# Patient Record
Sex: Female | Born: 1965
Health system: Southern US, Community
[De-identification: ages and names within clinical notes are randomized; demographics above are authoritative.]

## PROBLEM LIST (undated history)

## (undated) DIAGNOSIS — F419 Anxiety disorder, unspecified: Secondary | ICD-10-CM

## (undated) DIAGNOSIS — N189 Chronic kidney disease, unspecified: Secondary | ICD-10-CM

## (undated) DIAGNOSIS — E119 Type 2 diabetes mellitus without complications: Secondary | ICD-10-CM

## (undated) DIAGNOSIS — Z9889 Other specified postprocedural states: Secondary | ICD-10-CM

## (undated) DIAGNOSIS — F329 Major depressive disorder, single episode, unspecified: Secondary | ICD-10-CM

## (undated) DIAGNOSIS — E041 Nontoxic single thyroid nodule: Secondary | ICD-10-CM

## (undated) DIAGNOSIS — C801 Malignant (primary) neoplasm, unspecified: Secondary | ICD-10-CM

## (undated) DIAGNOSIS — M1990 Unspecified osteoarthritis, unspecified site: Secondary | ICD-10-CM

## (undated) DIAGNOSIS — R0602 Shortness of breath: Secondary | ICD-10-CM

## (undated) DIAGNOSIS — F4310 Post-traumatic stress disorder, unspecified: Secondary | ICD-10-CM

## (undated) DIAGNOSIS — R51 Headache: Secondary | ICD-10-CM

## (undated) DIAGNOSIS — J40 Bronchitis, not specified as acute or chronic: Secondary | ICD-10-CM

## (undated) DIAGNOSIS — J45909 Unspecified asthma, uncomplicated: Secondary | ICD-10-CM

## (undated) DIAGNOSIS — K219 Gastro-esophageal reflux disease without esophagitis: Secondary | ICD-10-CM

## (undated) DIAGNOSIS — G4730 Sleep apnea, unspecified: Secondary | ICD-10-CM

## (undated) DIAGNOSIS — D649 Anemia, unspecified: Secondary | ICD-10-CM

## (undated) DIAGNOSIS — R112 Nausea with vomiting, unspecified: Secondary | ICD-10-CM

## (undated) DIAGNOSIS — F32A Depression, unspecified: Secondary | ICD-10-CM

## (undated) HISTORY — PX: APPENDECTOMY: SHX54

## (undated) HISTORY — PX: OTHER SURGICAL HISTORY: SHX169

## (undated) HISTORY — PX: HERNIA REPAIR: SHX51

## (undated) HISTORY — PX: SHOULDER OPEN ROTATOR CUFF REPAIR: SHX2407

## (undated) HISTORY — PX: OVARIAN CYST SURGERY: SHX726

## (undated) HISTORY — PX: SEPTOPLASTY: SUR1290

## (undated) HISTORY — PX: CHOLECYSTECTOMY: SHX55

## (undated) HISTORY — PX: PLANTAR FASCIA RELEASE: SHX2239

## (undated) HISTORY — PX: BREAST SURGERY: SHX581

## (undated) HISTORY — PX: KNEE ARTHROSCOPY: SUR90

## (undated) HISTORY — PX: TONSILLECTOMY: SUR1361

---

## 2005-08-05 HISTORY — PX: GASTRIC BYPASS: SHX52

## 2008-12-19 ENCOUNTER — Emergency Department (HOSPITAL_COMMUNITY): Admission: EM | Admit: 2008-12-19 | Discharge: 2008-12-19 | Payer: Self-pay | Admitting: Emergency Medicine

## 2013-01-18 DIAGNOSIS — Z9889 Other specified postprocedural states: Secondary | ICD-10-CM | POA: Insufficient documentation

## 2014-01-27 ENCOUNTER — Other Ambulatory Visit: Payer: Self-pay | Admitting: Orthopedic Surgery

## 2014-02-15 ENCOUNTER — Ambulatory Visit (HOSPITAL_COMMUNITY)
Admission: RE | Admit: 2014-02-15 | Discharge: 2014-02-15 | Disposition: A | Discharge status: Home / Self Care | Payer: Medicaid Other | Source: Ambulatory Visit | Attending: Orthopedic Surgery | Admitting: Orthopedic Surgery

## 2014-02-15 ENCOUNTER — Encounter (HOSPITAL_COMMUNITY): Payer: Self-pay

## 2014-02-15 ENCOUNTER — Encounter (HOSPITAL_COMMUNITY)
Admission: RE | Admit: 2014-02-15 | Discharge: 2014-02-15 | Disposition: A | Discharge status: Home / Self Care | Payer: Medicaid Other | Source: Ambulatory Visit | Attending: Orthopedic Surgery | Admitting: Orthopedic Surgery

## 2014-02-15 DIAGNOSIS — Z01818 Encounter for other preprocedural examination: Secondary | ICD-10-CM | POA: Insufficient documentation

## 2014-02-15 HISTORY — DX: Chronic kidney disease, unspecified: N18.9

## 2014-02-15 HISTORY — DX: Depression, unspecified: F32.A

## 2014-02-15 HISTORY — DX: Anxiety disorder, unspecified: F41.9

## 2014-02-15 HISTORY — DX: Malignant (primary) neoplasm, unspecified: C80.1

## 2014-02-15 HISTORY — DX: Shortness of breath: R06.02

## 2014-02-15 HISTORY — DX: Nausea with vomiting, unspecified: R11.2

## 2014-02-15 HISTORY — DX: Anemia, unspecified: D64.9

## 2014-02-15 HISTORY — DX: Gastro-esophageal reflux disease without esophagitis: K21.9

## 2014-02-15 HISTORY — DX: Major depressive disorder, single episode, unspecified: F32.9

## 2014-02-15 HISTORY — DX: Headache: R51

## 2014-02-15 HISTORY — DX: Bronchitis, not specified as acute or chronic: J40

## 2014-02-15 HISTORY — DX: Type 2 diabetes mellitus without complications: E11.9

## 2014-02-15 HISTORY — DX: Other specified postprocedural states: Z98.890

## 2014-02-15 HISTORY — DX: Unspecified osteoarthritis, unspecified site: M19.90

## 2014-02-15 HISTORY — DX: Sleep apnea, unspecified: G47.30

## 2014-02-15 LAB — BASIC METABOLIC PANEL
ANION GAP: 12 (ref 5–15)
BUN: 7 mg/dL (ref 6–23)
CALCIUM: 8.9 mg/dL (ref 8.4–10.5)
CO2: 26 meq/L (ref 19–32)
CREATININE: 0.65 mg/dL (ref 0.50–1.10)
Chloride: 104 mEq/L (ref 96–112)
GFR calc non Af Amer: >90 mL/min (ref >90)
Glucose, Bld: 104 mg/dL — ABNORMAL HIGH (ref 70–99)
Potassium: 4 mEq/L (ref 3.7–5.3)
Sodium: 142 mEq/L (ref 137–147)

## 2014-02-15 LAB — CBC WITH DIFFERENTIAL/PLATELET
BASOS ABS: 0 10*3/uL (ref 0.0–0.1)
BASOS PCT: 0 % (ref 0–1)
EOS ABS: 0.1 10*3/uL (ref 0.0–0.7)
EOS PCT: 1 % (ref 0–5)
HEMATOCRIT: 36.2 % (ref 36.0–46.0)
Hemoglobin: 11.3 g/dL — ABNORMAL LOW (ref 12.0–15.0)
Lymphocytes Relative: 31 % (ref 12–46)
Lymphs Abs: 2.3 10*3/uL (ref 0.7–4.0)
MCH: 26.7 pg (ref 26.0–34.0)
MCHC: 31.2 g/dL (ref 30.0–36.0)
MCV: 85.6 fL (ref 78.0–100.0)
MONO ABS: 0.4 10*3/uL (ref 0.1–1.0)
Monocytes Relative: 6 % (ref 3–12)
NEUTROS ABS: 4.5 10*3/uL (ref 1.7–7.7)
Neutrophils Relative %: 62 % (ref 43–77)
Platelets: 249 10*3/uL (ref 150–400)
RBC: 4.23 MIL/uL (ref 3.87–5.11)
RDW: 15 % (ref 11.5–15.5)
WBC: 7.4 10*3/uL (ref 4.0–10.5)

## 2014-02-15 LAB — URINALYSIS, ROUTINE W REFLEX MICROSCOPIC
Bilirubin Urine: NEGATIVE
GLUCOSE, UA: NEGATIVE mg/dL
Ketones, ur: NEGATIVE mg/dL
LEUKOCYTES UA: NEGATIVE
NITRITE: NEGATIVE
Protein, ur: NEGATIVE mg/dL
SPECIFIC GRAVITY, URINE: 1.012 (ref 1.005–1.030)
Urobilinogen, UA: 1 mg/dL (ref 0.0–1.0)
pH: 6 (ref 5.0–8.0)

## 2014-02-15 LAB — TYPE AND SCREEN
ABO/RH(D): O POS
Antibody Screen: NEGATIVE

## 2014-02-15 LAB — SURGICAL PCR SCREEN
MRSA, PCR: NEGATIVE
STAPHYLOCOCCUS AUREUS: NEGATIVE

## 2014-02-15 LAB — URINE MICROSCOPIC-ADD ON

## 2014-02-15 LAB — APTT: APTT: 30 s (ref 24–37)

## 2014-02-15 LAB — ABO/RH: ABO/RH(D): O POS

## 2014-02-15 LAB — PROTIME-INR
INR: 1.02 (ref 0.00–1.49)
Prothrombin Time: 13.4 seconds (ref 11.6–15.2)

## 2014-02-15 MED ORDER — CHLORHEXIDINE GLUCONATE 4 % EX LIQD: 60.0000 mL | Freq: Once | CUTANEOUS | Status: DC

## 2014-02-15 NOTE — Pre-Procedure Instructions (Addendum)
Sally Garrison  02/15/2014   Your procedure is scheduled on:  Monday July 27 th at 0900 AM  Report to [facility] Admitting at 0700 AM.  Call this number if you have problems the morning of surgery: [phone]   Remember:   Do not eat food or drink liquids after midnight Sunday.   Take these medicines the morning of surgery with A SIP OF WATER: Prozac, Hydrocodone-acetaminophen if needed for pain, and Prilosec. Stop Meloxicam, , Aspirin, Nsaids, and herbal meds.  Do not wear jewelry, make-up or nail polish.  Do not wear lotions, powders, or perfumes. You may wear deodorant.  Do not shave 48 hours prior to surgery.   Do not bring valuables to the hospital.  [facility] is not responsible for any belongings or valuables.               Contacts, dentures or bridgework may not be worn into surgery.  Leave suitcase in the car. After surgery it may be brought to your room.  For patients admitted to the hospital, discharge time is determined by your treatment team.               Patients discharged the day of surgery will not be allowed to drive home.    Special Instructions: [place] - Preparing for Surgery  Before surgery, you can play an important role.  Because skin is not sterile, your skin needs to be as free of germs as possible.  You can reduce the number of germs on you skin by washing with CHG (chlorahexidine gluconate) soap before surgery.  CHG is an antiseptic cleaner which kills germs and bonds with the skin to continue killing germs even after washing.  Please DO NOT use if you have an allergy to CHG or antibacterial soaps.  If your skin becomes reddened/irritated stop using the CHG and inform your nurse when you arrive at Short Stay.  Do not shave (including legs and underarms) for at least 48 hours prior to the first CHG shower.  You may shave your face.  Please follow these instructions carefully:   1.  Shower with CHG Soap the night before surgery and the                                 morning of Surgery.  2.  If you choose to wash your hair, wash your hair first as usual with your       normal shampoo.  3.  After you shampoo, rinse your hair and body thoroughly to remove the                      Shampoo.  4.  Use CHG as you would any other liquid soap.  You can apply chg directly       to the skin and wash gently with scrungie or a clean washcloth.  5.  Apply the CHG Soap to your body ONLY FROM THE NECK DOWN.        Do not use on open wounds or open sores.  Avoid contact with your eyes,       ears, mouth and genitals (private parts).  Wash genitals (private parts)       with your normal soap.  6.  Wash thoroughly, paying special attention to the area where your surgery        will be performed.  7.  Thoroughly rinse your body with warm water from the neck down.  8.  DO NOT shower/wash with your normal soap after using and rinsing off       the CHG Soap.  9.  Pat yourself dry with a clean towel.            10.  Wear clean pajamas.            11.  Place clean sheets on your bed the night of your first shower and do not        sleep with pets.  Day of Surgery  Do not apply any lotions/deoderants the morning of surgery.  Please wear clean clothes to the hospital/surgery center.      Please read over the following fact sheets that you were given: Pain Booklet, Coughing and Deep Breathing, Blood Transfusion Information, MRSA Information and Surgical Site Infection Prevention

## 2014-02-16 ENCOUNTER — Other Ambulatory Visit (HOSPITAL_COMMUNITY): Payer: Self-pay

## 2014-02-27 DIAGNOSIS — M1711 Unilateral primary osteoarthritis, right knee: Secondary | ICD-10-CM | POA: Diagnosis present

## 2014-02-27 MED ORDER — TRANEXAMIC ACID 100 MG/ML IV SOLN
1000.0000 mg | INTRAVENOUS | Status: AC
  Administered 2014-02-28: 1000 mg via INTRAVENOUS
  Filled 2014-02-27: qty 10

## 2014-02-27 MED ORDER — BUPIVACAINE LIPOSOME 1.3 % IJ SUSP
20.0000 mL | Freq: Once | INTRAMUSCULAR | Status: DC
Start: 2014-02-27 — End: 2014-02-28
  Filled 2014-02-27: qty 20

## 2014-02-27 NOTE — H&P (Signed)
TOTAL KNEE ADMISSION H&P  Patient is being admitted for right total knee arthroplasty.  Subjective:  Chief Complaint:right knee pain.  HPI: Sally Garrison, 48 y.o. female, has a history of pain and functional disability in the right knee due to arthritis and has failed non-surgical conservative treatments for greater than 12 weeks to includeNSAID's and/or analgesics, corticosteriod injections, viscosupplementation injections, flexibility and strengthening excercises and activity modification.  Onset of symptoms was gradual, starting 5 years ago with gradually worsening course since that time. The patient noted no past surgery on the right knee(s).  Patient currently rates pain in the right knee(s) at 10 out of 10 with activity. Patient has worsening of pain with activity and weight bearing, pain that interferes with activities of daily living, pain with passive range of motion and crepitus.  Patient has evidence of periarticular osteophytes and joint space narrowing by imaging studies. There is no active infection.  There are no active problems to display for this patient.  Past Medical History  Diagnosis Date  . PONV (postoperative nausea and vomiting)   . Sleep apnea     not using CPAP  . Shortness of breath   . Bronchitis   . Diabetes mellitus without complication     had gastric by-pass not on meds  . Depression   . Anxiety   . Chronic kidney disease     kidney stones  . GERD (gastroesophageal reflux disease)   . Headache(784.0)     migraines  . Arthritis   . Cancer     cervical Cancer  . Anemia     on monthly period since June    Past Surgical History  Procedure Laterality Date  . Septoplasty    . Tonsillectomy      adenoidectomy  . Shoulder open rotator cuff repair Right   . Breast surgery Right     lumpectomy x2  . Gastric bypass  2007  . Cholecystectomy    . Hernia repair      abdominal x3  . Appendectomy    . Ovarian cyst surgery    . Cesarean section    . Knee  arthroscopy Right   . Bone spur    . Plantar fascia release Bilateral     No prescriptions prior to admission   Allergies  Allergen Reactions  . Penicillins Anaphylaxis  . Avelox [Moxifloxacin Hcl In Nacl] Hives  . Terramycin [Oxytetracycline] Itching    History  Substance Use Topics  . Smoking status: Current Every Day Smoker -- 0.25 packs/day for 20 years    Types: Cigarettes  . Smokeless tobacco: Never Used  . Alcohol Use: No    No family history on file.   Review of Systems  Constitutional: Negative.   Eyes: Negative.   Respiratory: Negative.   Gastrointestinal: Negative.   Genitourinary: Negative.   Musculoskeletal: Positive for joint pain.  Skin: Negative.   Neurological: Positive for headaches.  Endo/Heme/Allergies: Bruises/bleeds easily.  Psychiatric/Behavioral: Positive for depression.       Sleep disorder    Objective:  Physical Exam  Constitutional: She is oriented to person, place, and time. She appears well-developed and well-nourished.  HENT:  Head: Normocephalic and atraumatic.  Neck: Normal range of motion. Neck supple.  Cardiovascular: Intact distal pulses.   Respiratory: Effort normal.  Musculoskeletal: She exhibits tenderness.  She is quite tender along the medial joint line.  Range of motion is 5 to 125 collateral ligaments are stable and she remains neurovascularly intact.  Neurological: She  is alert and oriented to person, place, and time.  Skin: Skin is warm and dry.  Psychiatric: She has a normal mood and affect. Her behavior is normal. Judgment and thought content normal.    Vital signs in last 24 hours:    Labs:   There is no height or weight on file to calculate BMI.   Imaging Review Radiographs:  X-rays were ordered, performed, and interpreted by me today included; standing AP, Rosenberg, lateral and sunrise x-rays shows moderate approaching severe arthritis of loss of articular cartilage the medial compartment right greater  than left, down to one or 2 mm medially on the right, 2-3 on the left.  There are also peripheral osteophytes to the patella on the sunrise view as well as to the trochlea.  Assessment/Plan:  End stage arthritis, right knee   The patient history, physical examination, clinical judgment of the provider and imaging studies are consistent with end stage degenerative joint disease of the right knee(s) and total knee arthroplasty is deemed medically necessary. The treatment options including medical management, injection therapy arthroscopy and arthroplasty were discussed at length. The risks and benefits of total knee arthroplasty were presented and reviewed. The risks due to aseptic loosening, infection, stiffness, patella tracking problems, thromboembolic complications and other imponderables were discussed. The patient acknowledged the explanation, agreed to proceed with the plan and consent was signed. Patient is being admitted for inpatient treatment for surgery, pain control, PT, OT, prophylactic antibiotics, VTE prophylaxis, progressive ambulation and ADL's and discharge planning. The patient is planning to be discharged home with home health services

## 2014-02-28 ENCOUNTER — Inpatient Hospital Stay (HOSPITAL_COMMUNITY)
Admission: RE | Admit: 2014-02-28 | Discharge: 2014-03-02 | DRG: 470 | Disposition: A | Discharge status: Home Health | Payer: Medicaid Other | Source: Ambulatory Visit | Attending: Orthopedic Surgery | Admitting: Orthopedic Surgery

## 2014-02-28 ENCOUNTER — Encounter (HOSPITAL_COMMUNITY): Payer: Medicaid Other | Admitting: Anesthesiology

## 2014-02-28 ENCOUNTER — Encounter (HOSPITAL_COMMUNITY): Payer: Self-pay | Admitting: Anesthesiology

## 2014-02-28 ENCOUNTER — Inpatient Hospital Stay: Admit: 2014-02-28 | Payer: Self-pay | Admitting: Orthopedic Surgery

## 2014-02-28 ENCOUNTER — Encounter (HOSPITAL_COMMUNITY)
Admission: RE | Disposition: A | Discharge status: Home Health | Payer: Self-pay | Source: Ambulatory Visit | Attending: Orthopedic Surgery

## 2014-02-28 ENCOUNTER — Inpatient Hospital Stay (HOSPITAL_COMMUNITY): Payer: Medicaid Other | Admitting: Anesthesiology

## 2014-02-28 DIAGNOSIS — K219 Gastro-esophageal reflux disease without esophagitis: Secondary | ICD-10-CM | POA: Diagnosis present

## 2014-02-28 DIAGNOSIS — M1710 Unilateral primary osteoarthritis, unspecified knee: Principal | ICD-10-CM | POA: Diagnosis present

## 2014-02-28 DIAGNOSIS — F3289 Other specified depressive episodes: Secondary | ICD-10-CM | POA: Diagnosis present

## 2014-02-28 DIAGNOSIS — Z8541 Personal history of malignant neoplasm of cervix uteri: Secondary | ICD-10-CM

## 2014-02-28 DIAGNOSIS — N189 Chronic kidney disease, unspecified: Secondary | ICD-10-CM | POA: Diagnosis present

## 2014-02-28 DIAGNOSIS — M1711 Unilateral primary osteoarthritis, right knee: Secondary | ICD-10-CM | POA: Diagnosis present

## 2014-02-28 DIAGNOSIS — E119 Type 2 diabetes mellitus without complications: Secondary | ICD-10-CM | POA: Diagnosis present

## 2014-02-28 DIAGNOSIS — F17200 Nicotine dependence, unspecified, uncomplicated: Secondary | ICD-10-CM | POA: Diagnosis present

## 2014-02-28 DIAGNOSIS — Z9884 Bariatric surgery status: Secondary | ICD-10-CM

## 2014-02-28 DIAGNOSIS — F411 Generalized anxiety disorder: Secondary | ICD-10-CM | POA: Diagnosis present

## 2014-02-28 DIAGNOSIS — M25569 Pain in unspecified knee: Secondary | ICD-10-CM | POA: Diagnosis present

## 2014-02-28 DIAGNOSIS — D649 Anemia, unspecified: Secondary | ICD-10-CM | POA: Diagnosis present

## 2014-02-28 DIAGNOSIS — F329 Major depressive disorder, single episode, unspecified: Secondary | ICD-10-CM | POA: Diagnosis present

## 2014-02-28 DIAGNOSIS — G4730 Sleep apnea, unspecified: Secondary | ICD-10-CM | POA: Diagnosis present

## 2014-02-28 DIAGNOSIS — Z6836 Body mass index (BMI) 36.0-36.9, adult: Secondary | ICD-10-CM | POA: Diagnosis not present

## 2014-02-28 HISTORY — PX: TOTAL KNEE ARTHROPLASTY: SHX125

## 2014-02-28 LAB — GLUCOSE, CAPILLARY
GLUCOSE-CAPILLARY: 227 mg/dL — AB (ref 70–99)
GLUCOSE-CAPILLARY: 153 mg/dL — AB (ref 70–99)
Glucose-Capillary: 84 mg/dL (ref 70–99)
Glucose-Capillary: 130 mg/dL — ABNORMAL HIGH (ref 70–99)

## 2014-02-28 SURGERY — ARTHROPLASTY, KNEE, TOTAL
Anesthesia: Choice | Laterality: Right

## 2014-02-28 SURGERY — ARTHROPLASTY, KNEE, TOTAL
Anesthesia: General | Laterality: Right

## 2014-02-28 MED ORDER — OXYCODONE HCL 5 MG PO TABS: 5.0000 mg | ORAL_TABLET | Freq: Once | ORAL | Status: DC | PRN

## 2014-02-28 MED ORDER — DEXAMETHASONE SODIUM PHOSPHATE 4 MG/ML IJ SOLN
INTRAMUSCULAR | Status: DC | PRN
  Administered 2014-02-28: 8 mg via INTRAVENOUS

## 2014-02-28 MED ORDER — GLYCOPYRROLATE 0.2 MG/ML IJ SOLN
INTRAMUSCULAR | Status: DC | PRN
  Administered 2014-02-28: .8 mg via INTRAVENOUS

## 2014-02-28 MED ORDER — KCL IN DEXTROSE-NACL 20-5-0.45 MEQ/L-%-% IV SOLN
INTRAVENOUS | Status: DC
  Filled 2014-02-28 (×10): qty 1000

## 2014-02-28 MED ORDER — PNEUMOCOCCAL VAC POLYVALENT 25 MCG/0.5ML IJ INJ
0.5000 mL | INJECTION | INTRAMUSCULAR | Status: AC
  Administered 2014-03-01: 0.5 mL via INTRAMUSCULAR
  Filled 2014-02-28 (×2): qty 0.5

## 2014-02-28 MED ORDER — ROCURONIUM BROMIDE 100 MG/10ML IV SOLN
INTRAVENOUS | Status: DC | PRN
  Administered 2014-02-28: 40 mg via INTRAVENOUS

## 2014-02-28 MED ORDER — FLEET ENEMA 7-19 GM/118ML RE ENEM: 1.0000 | ENEMA | Freq: Once | RECTAL | Status: AC | PRN

## 2014-02-28 MED ORDER — BUPIVACAINE LIPOSOME 1.3 % IJ SUSP
INTRAMUSCULAR | Status: DC | PRN
  Administered 2014-02-28: 20 mL

## 2014-02-28 MED ORDER — NEOSTIGMINE METHYLSULFATE 10 MG/10ML IV SOLN
INTRAVENOUS | Status: DC | PRN
  Administered 2014-02-28: 4 mg via INTRAVENOUS

## 2014-02-28 MED ORDER — CLINDAMYCIN PHOSPHATE 600 MG/50ML IV SOLN
INTRAVENOUS | Status: AC
  Administered 2014-02-28: 600 mg via INTRAVENOUS
  Filled 2014-02-28: qty 50

## 2014-02-28 MED ORDER — BISACODYL 5 MG PO TBEC: 5.0000 mg | DELAYED_RELEASE_TABLET | Freq: Every day | ORAL | Status: DC | PRN

## 2014-02-28 MED ORDER — SODIUM CHLORIDE 0.9 % IR SOLN
Status: DC | PRN
  Administered 2014-02-28: 1000 mL

## 2014-02-28 MED ORDER — ONDANSETRON HCL 4 MG/2ML IJ SOLN
INTRAMUSCULAR | Status: DC | PRN
  Administered 2014-02-28: 4 mg via INTRAVENOUS

## 2014-02-28 MED ORDER — LACTATED RINGERS IV SOLN
INTRAVENOUS | Status: DC | PRN
  Administered 2014-02-28: 08:00:00 via INTRAVENOUS

## 2014-02-28 MED ORDER — MENTHOL 3 MG MT LOZG: 1.0000 | LOZENGE | OROMUCOSAL | Status: DC | PRN

## 2014-02-28 MED ORDER — DEXTROSE 5 % IV SOLN
500.0000 mg | Freq: Four times a day (QID) | INTRAVENOUS | Status: DC | PRN
  Filled 2014-02-28: qty 5

## 2014-02-28 MED ORDER — OXYCODONE HCL 5 MG/5ML PO SOLN: 5.0000 mg | Freq: Once | ORAL | Status: DC | PRN

## 2014-02-28 MED ORDER — LACTATED RINGERS IV SOLN: INTRAVENOUS | Status: DC | PRN

## 2014-02-28 MED ORDER — PROPOFOL 10 MG/ML IV BOLUS
INTRAVENOUS | Status: DC | PRN
  Administered 2014-02-28: 180 mg via INTRAVENOUS

## 2014-02-28 MED ORDER — ACETAMINOPHEN 650 MG RE SUPP: 650.0000 mg | Freq: Four times a day (QID) | RECTAL | Status: DC | PRN

## 2014-02-28 MED ORDER — OXYCODONE HCL 5 MG PO TABS
ORAL_TABLET | ORAL | Status: AC
  Filled 2014-02-28: qty 2

## 2014-02-28 MED ORDER — FLUOXETINE HCL 20 MG PO CAPS
40.0000 mg | ORAL_CAPSULE | Freq: Every day | ORAL | Status: DC
  Administered 2014-02-28 – 2014-03-02 (×3): 40 mg via ORAL
  Filled 2014-02-28 (×3): qty 2

## 2014-02-28 MED ORDER — 0.9 % SODIUM CHLORIDE (POUR BTL) OPTIME
TOPICAL | Status: DC | PRN
  Administered 2014-02-28: 1000 mL

## 2014-02-28 MED ORDER — PANTOPRAZOLE SODIUM 40 MG PO TBEC
40.0000 mg | DELAYED_RELEASE_TABLET | Freq: Every day | ORAL | Status: DC
  Administered 2014-02-28 – 2014-03-02 (×3): 40 mg via ORAL
  Filled 2014-02-28 (×3): qty 1

## 2014-02-28 MED ORDER — HYDROMORPHONE HCL PF 1 MG/ML IJ SOLN
INTRAMUSCULAR | Status: AC
  Filled 2014-02-28: qty 2

## 2014-02-28 MED ORDER — FENTANYL CITRATE 0.05 MG/ML IJ SOLN
INTRAMUSCULAR | Status: DC | PRN
  Administered 2014-02-28: 50 ug via INTRAVENOUS
  Administered 2014-02-28 (×2): 25 ug via INTRAVENOUS
  Administered 2014-02-28 (×2): 50 ug via INTRAVENOUS

## 2014-02-28 MED ORDER — PHENOL 1.4 % MT LIQD: 1.0000 | OROMUCOSAL | Status: DC | PRN

## 2014-02-28 MED ORDER — ONDANSETRON HCL 4 MG PO TABS
4.0000 mg | ORAL_TABLET | Freq: Four times a day (QID) | ORAL | Status: DC | PRN
  Administered 2014-03-02: 4 mg via ORAL
  Filled 2014-02-28: qty 1

## 2014-02-28 MED ORDER — METHOCARBAMOL 500 MG PO TABS
500.0000 mg | ORAL_TABLET | Freq: Four times a day (QID) | ORAL | Status: DC | PRN
  Administered 2014-02-28 – 2014-03-02 (×6): 500 mg via ORAL
  Filled 2014-02-28 (×5): qty 1

## 2014-02-28 MED ORDER — FENTANYL CITRATE 0.05 MG/ML IJ SOLN
INTRAMUSCULAR | Status: AC
  Filled 2014-02-28: qty 2

## 2014-02-28 MED ORDER — METOCLOPRAMIDE HCL 10 MG PO TABS: 5.0000 mg | ORAL_TABLET | Freq: Three times a day (TID) | ORAL | Status: DC | PRN

## 2014-02-28 MED ORDER — ASPIRIN EC 325 MG PO TBEC
325.0000 mg | DELAYED_RELEASE_TABLET | Freq: Every day | ORAL | Status: DC
  Administered 2014-03-02: 325 mg via ORAL
  Filled 2014-02-28 (×3): qty 1

## 2014-02-28 MED ORDER — CLINDAMYCIN PHOSPHATE 600 MG/50ML IV SOLN: 600.0000 mg | Freq: Once | INTRAVENOUS | Status: DC

## 2014-02-28 MED ORDER — ASPIRIN EC 325 MG PO TBEC: 325.0000 mg | DELAYED_RELEASE_TABLET | Freq: Two times a day (BID) | ORAL | Status: DC

## 2014-02-28 MED ORDER — ONDANSETRON HCL 4 MG/2ML IJ SOLN
4.0000 mg | Freq: Four times a day (QID) | INTRAMUSCULAR | Status: DC | PRN
  Administered 2014-03-01: 4 mg via INTRAVENOUS
  Filled 2014-02-28: qty 2

## 2014-02-28 MED ORDER — HYDROMORPHONE HCL PF 1 MG/ML IJ SOLN
0.5000 mg | INTRAMUSCULAR | Status: DC | PRN
Start: 2014-02-28 — End: 2014-03-02
  Administered 2014-02-28 – 2014-03-01 (×5): 1 mg via INTRAVENOUS
  Filled 2014-02-28 (×5): qty 1

## 2014-02-28 MED ORDER — DEXTROSE-NACL 5-0.45 % IV SOLN: INTRAVENOUS | Status: DC

## 2014-02-28 MED ORDER — DOCUSATE SODIUM 100 MG PO CAPS
100.0000 mg | ORAL_CAPSULE | Freq: Two times a day (BID) | ORAL | Status: DC
  Administered 2014-02-28 – 2014-03-02 (×4): 100 mg via ORAL
  Filled 2014-02-28 (×5): qty 1

## 2014-02-28 MED ORDER — ACETAMINOPHEN 325 MG PO TABS
650.0000 mg | ORAL_TABLET | Freq: Four times a day (QID) | ORAL | Status: DC | PRN
  Administered 2014-02-28 – 2014-03-01 (×2): 650 mg via ORAL
  Filled 2014-02-28 (×2): qty 2

## 2014-02-28 MED ORDER — HYDROMORPHONE HCL PF 1 MG/ML IJ SOLN
0.2500 mg | INTRAMUSCULAR | Status: DC | PRN
  Administered 2014-02-28 (×4): 0.5 mg via INTRAVENOUS

## 2014-02-28 MED ORDER — MIDAZOLAM HCL 5 MG/5ML IJ SOLN
INTRAMUSCULAR | Status: DC | PRN
  Administered 2014-02-28 (×2): 1 mg via INTRAVENOUS

## 2014-02-28 MED ORDER — SODIUM CHLORIDE 0.9 % IJ SOLN
INTRAMUSCULAR | Status: DC | PRN
  Administered 2014-02-28: 40 mL via INTRAVENOUS

## 2014-02-28 MED ORDER — LACTATED RINGERS IV SOLN
INTRAVENOUS | Status: DC
  Administered 2014-02-28: 08:00:00 via INTRAVENOUS

## 2014-02-28 MED ORDER — MIDAZOLAM HCL 2 MG/2ML IJ SOLN
INTRAMUSCULAR | Status: AC
  Filled 2014-02-28: qty 2

## 2014-02-28 MED ORDER — FENTANYL CITRATE 0.05 MG/ML IJ SOLN
INTRAMUSCULAR | Status: AC
  Filled 2014-02-28: qty 5

## 2014-02-28 MED ORDER — SENNOSIDES-DOCUSATE SODIUM 8.6-50 MG PO TABS: 1.0000 | ORAL_TABLET | Freq: Every evening | ORAL | Status: DC | PRN

## 2014-02-28 MED ORDER — METOCLOPRAMIDE HCL 5 MG/ML IJ SOLN: 10.0000 mg | Freq: Once | INTRAMUSCULAR | Status: DC | PRN

## 2014-02-28 MED ORDER — LIDOCAINE HCL (CARDIAC) 20 MG/ML IV SOLN
INTRAVENOUS | Status: DC | PRN
  Administered 2014-02-28: 60 mg via INTRAVENOUS

## 2014-02-28 MED ORDER — METOCLOPRAMIDE HCL 5 MG/ML IJ SOLN: 5.0000 mg | Freq: Three times a day (TID) | INTRAMUSCULAR | Status: DC | PRN

## 2014-02-28 MED ORDER — DIPHENHYDRAMINE HCL 12.5 MG/5ML PO ELIX: 12.5000 mg | ORAL_SOLUTION | ORAL | Status: DC | PRN

## 2014-02-28 MED ORDER — METHOCARBAMOL 500 MG PO TABS
ORAL_TABLET | ORAL | Status: AC
  Filled 2014-02-28: qty 1

## 2014-02-28 MED ORDER — CEFAZOLIN SODIUM-DEXTROSE 2-3 GM-% IV SOLR
INTRAVENOUS | Status: AC
  Filled 2014-02-28: qty 50

## 2014-02-28 MED ORDER — PROPOFOL 10 MG/ML IV BOLUS
INTRAVENOUS | Status: AC
Start: 2014-02-28 — End: 2014-02-28
  Filled 2014-02-28: qty 20

## 2014-02-28 MED ORDER — ALUM & MAG HYDROXIDE-SIMETH 200-200-20 MG/5ML PO SUSP: 30.0000 mL | ORAL | Status: DC | PRN

## 2014-02-28 MED ORDER — OXYCODONE HCL 5 MG PO TABS
5.0000 mg | ORAL_TABLET | ORAL | Status: DC | PRN
  Administered 2014-02-28 – 2014-03-02 (×14): 10 mg via ORAL
  Filled 2014-02-28 (×13): qty 2

## 2014-02-28 MED ORDER — OXYCODONE-ACETAMINOPHEN 5-325 MG PO TABS: 1.0000 | ORAL_TABLET | ORAL | Status: DC | PRN

## 2014-02-28 SURGICAL SUPPLY — 62 items
BANDAGE ELASTIC 6 VELCRO ST LF (GAUZE/BANDAGES/DRESSINGS) ×2 IMPLANT
BANDAGE ESMARK 6X9 LF (GAUZE/BANDAGES/DRESSINGS) ×1 IMPLANT
BLADE SAG 18X100X1.27 (BLADE) ×3 IMPLANT
BLADE SAW SGTL 13X75X1.27 (BLADE) ×3 IMPLANT
BLADE SURG ROTATE 9660 (MISCELLANEOUS) IMPLANT
BNDG CMPR 9X6 STRL LF SNTH (GAUZE/BANDAGES/DRESSINGS) ×1
BNDG CMPR MED 10X6 ELC LF (GAUZE/BANDAGES/DRESSINGS) ×1
BNDG ELASTIC 6X10 VLCR STRL LF (GAUZE/BANDAGES/DRESSINGS) ×3 IMPLANT
BNDG ESMARK 6X9 LF (GAUZE/BANDAGES/DRESSINGS) ×3
BOWL SMART MIX CTS (DISPOSABLE) ×3 IMPLANT
CAPT RP KNEE ×2 IMPLANT
CEMENT HV SMART SET (Cement) ×6 IMPLANT
COVER SURGICAL LIGHT HANDLE (MISCELLANEOUS) ×3 IMPLANT
CUFF TOURNIQUET SINGLE 34IN LL (TOURNIQUET CUFF) IMPLANT
CUFF TOURNIQUET SINGLE 44IN (TOURNIQUET CUFF) IMPLANT
DRAPE EXTREMITY T 121X128X90 (DRAPE) ×3 IMPLANT
DRAPE U-SHAPE 47X51 STRL (DRAPES) ×3 IMPLANT
DURAPREP 26ML APPLICATOR (WOUND CARE) ×6 IMPLANT
ELECT REM PT RETURN 9FT ADLT (ELECTROSURGICAL) ×3
ELECTRODE REM PT RTRN 9FT ADLT (ELECTROSURGICAL) ×1 IMPLANT
EVACUATOR 1/8 PVC DRAIN (DRAIN) ×3 IMPLANT
GAUZE XEROFORM 1X8 LF (GAUZE/BANDAGES/DRESSINGS) ×3 IMPLANT
GLOVE BIO SURGEON STRL SZ7.5 (GLOVE) ×3 IMPLANT
GLOVE BIO SURGEON STRL SZ8.5 (GLOVE) ×3 IMPLANT
GLOVE BIOGEL PI IND STRL 8 (GLOVE) ×1 IMPLANT
GLOVE BIOGEL PI IND STRL 9 (GLOVE) ×1 IMPLANT
GLOVE BIOGEL PI INDICATOR 8 (GLOVE) ×2
GLOVE BIOGEL PI INDICATOR 9 (GLOVE) ×2
GOWN STRL REUS W/ TWL LRG LVL3 (GOWN DISPOSABLE) ×1 IMPLANT
GOWN STRL REUS W/ TWL XL LVL3 (GOWN DISPOSABLE) ×2 IMPLANT
GOWN STRL REUS W/TWL LRG LVL3 (GOWN DISPOSABLE) ×3
GOWN STRL REUS W/TWL XL LVL3 (GOWN DISPOSABLE) ×6
HANDPIECE INTERPULSE COAX TIP (DISPOSABLE) ×3
HOOD PEEL AWAY FACE SHEILD DIS (HOOD) ×6 IMPLANT
KIT BASIN OR (CUSTOM PROCEDURE TRAY) ×3 IMPLANT
KIT ROOM TURNOVER OR (KITS) ×3 IMPLANT
MANIFOLD NEPTUNE II (INSTRUMENTS) ×3 IMPLANT
NDL SAFETY ECLIPSE 18X1.5 (NEEDLE) IMPLANT
NDL SPNL 18GX3.5 QUINCKE PK (NEEDLE) IMPLANT
NEEDLE 22X1 1/2 (OR ONLY) (NEEDLE) ×3 IMPLANT
NEEDLE HYPO 18GX1.5 SHARP (NEEDLE)
NEEDLE SPNL 18GX3.5 QUINCKE PK (NEEDLE) IMPLANT
NS IRRIG 1000ML POUR BTL (IV SOLUTION) ×3 IMPLANT
PACK TOTAL JOINT (CUSTOM PROCEDURE TRAY) ×3 IMPLANT
PAD ARMBOARD 7.5X6 YLW CONV (MISCELLANEOUS) ×6 IMPLANT
PADDING CAST COTTON 6X4 STRL (CAST SUPPLIES) ×3 IMPLANT
SET HNDPC FAN SPRY TIP SCT (DISPOSABLE) ×1 IMPLANT
SPONGE GAUZE 4X4 12PLY (GAUZE/BANDAGES/DRESSINGS) ×6 IMPLANT
SPONGE GAUZE 4X4 12PLY STER LF (GAUZE/BANDAGES/DRESSINGS) ×2 IMPLANT
STAPLER VISISTAT 35W (STAPLE) ×3 IMPLANT
SUCTION FRAZIER TIP 10 FR DISP (SUCTIONS) ×3 IMPLANT
SUT VIC AB 0 CTX 36 (SUTURE) ×3
SUT VIC AB 0 CTX36XBRD ANTBCTR (SUTURE) ×1 IMPLANT
SUT VIC AB 1 CTX 36 (SUTURE) ×3
SUT VIC AB 1 CTX36XBRD ANBCTR (SUTURE) ×1 IMPLANT
SUT VIC AB 2-0 CT1 27 (SUTURE) ×3
SUT VIC AB 2-0 CT1 TAPERPNT 27 (SUTURE) ×1 IMPLANT
SYR 30ML LL (SYRINGE) ×3 IMPLANT
SYR 50ML LL SCALE MARK (SYRINGE) ×3 IMPLANT
TOWEL OR 17X24 6PK STRL BLUE (TOWEL DISPOSABLE) ×3 IMPLANT
TOWEL OR 17X26 10 PK STRL BLUE (TOWEL DISPOSABLE) ×3 IMPLANT
WATER STERILE IRR 1000ML POUR (IV SOLUTION) ×2 IMPLANT

## 2014-02-28 NOTE — Interval H&P Note (Signed)
History and Physical Interval Note:  02/28/2014 9:16 AM  Sally Garrison  has presented today for surgery, with the diagnosis of OSTEOARTHRITIS RIGHT KNEE  The various methods of treatment have been discussed with the patient and family. After consideration of risks, benefits and other options for treatment, the patient has consented to  Procedure(s): TOTAL KNEE ARTHROPLASTY (Right) as a surgical intervention .  The patient's history has been reviewed, patient examined, no change in status, stable for surgery.  I have reviewed the patient's chart and labs.  Questions were answered to the patient's satisfaction.     Kerin Salen

## 2014-02-28 NOTE — Plan of Care (Signed)
Problem: Consults Goal: Diagnosis- Total Joint Replacement Primary Total Knee Right     

## 2014-02-28 NOTE — Anesthesia Preprocedure Evaluation (Signed)
Anesthesia Evaluation  Patient identified by MRN, date of birth, ID band Patient awake    Reviewed: Allergy & Precautions, H&P , NPO status , Patient's Chart, lab work & pertinent test results, reviewed documented beta blocker date and time   History of Anesthesia Complications (+) PONV and history of anesthetic complications  Airway Mallampati: II TM Distance: >3 FB Neck ROM: full    Dental   Pulmonary shortness of breath and with exertion, sleep apnea , Current Smoker,  breath sounds clear to auscultation        Cardiovascular negative cardio ROS  Rhythm:regular     Neuro/Psych  Headaches, PSYCHIATRIC DISORDERS    GI/Hepatic Neg liver ROS, GERD-  Medicated and Controlled,  Endo/Other  negative endocrine ROSdiabetes  Renal/GU negative Renal ROS  negative genitourinary   Musculoskeletal   Abdominal   Peds  Hematology  (+) anemia ,   Anesthesia Other Findings See surgeon's H&P   Reproductive/Obstetrics negative OB ROS                           Anesthesia Physical Anesthesia Plan  ASA: III  Anesthesia Plan: General   Post-op Pain Management:    Induction: Intravenous  Airway Management Planned: Oral ETT  Additional Equipment:   Intra-op Plan:   Post-operative Plan: Extubation in OR  Informed Consent: I have reviewed the patients History and Physical, chart, labs and discussed the procedure including the risks, benefits and alternatives for the proposed anesthesia with the patient or authorized representative who has indicated his/her understanding and acceptance.   Dental Advisory Given  Plan Discussed with: CRNA and Surgeon  Anesthesia Plan Comments:         Anesthesia Quick Evaluation

## 2014-02-28 NOTE — Evaluation (Signed)
Physical Therapy Evaluation Patient Details Name: Sally Garrison MRN: [id number] DOB: 1966/05/08 Today's Date: 02/28/2014   History of Present Illness  [name] admitted 02/28/14 now s/p Rt TKA.  PMH:  sleep apnea, DM, anxiety, depression, CKD, migraines, anemia  Clinical Impression  Patient presents with problems listed below.  Will benefit from acute PT to maximize independence prior to discharge home.    Follow Up Recommendations Home health PT;Supervision/Assistance - 24 hour    Equipment Recommendations  None recommended by PT    Recommendations for Other Services       Precautions / Restrictions Precautions Precautions: Knee;Fall Precaution Booklet Issued: Yes (comment) Precaution Comments: Reviewed precautions with patient. Restrictions Weight Bearing Restrictions: Yes RLE Weight Bearing: Weight bearing as tolerated      Mobility  Bed Mobility Overal bed mobility: Needs Assistance Bed Mobility: Supine to Sit     Supine to sit: Min assist     General bed mobility comments: Verbal cues for technique.  Assist to move RLE off of bed.  Good balance in sitting.  Transfers Overall transfer level: Needs assistance Equipment used: Rolling walker (2 wheeled) Transfers: Sit to/from [facility] Sit to Stand: Min assist Stand pivot transfers: Min assist       General transfer comment: Verbal cues for hand placement and technique.  Assist to rise to standing from bed and BSC.  Patient able to take steps to pivot and transfer to [facility] and recliner.  Cues for technique to move to sitting.  Ambulation/Gait                Stairs            Wheelchair Mobility    Modified Rankin (Stroke Patients Only)       Balance                                             Pertinent Vitals/Pain     Home Living Family/patient expects to be discharged to:: Private residence Living Arrangements: Spouse/significant  other;Children Available Help at Discharge: Family;Available 24 hours/day Type of Home: Mobile home Home Access: Stairs to enter Entrance Stairs-Rails: Chemical engineer of Steps: 4 Home Layout: One level Home Equipment: Walker - 2 wheels;Bedside commode      Prior Function Level of Independence: Independent               Hand Dominance        Extremity/Trunk Assessment   Upper Extremity Assessment: Overall WFL for tasks assessed           Lower Extremity Assessment: RLE deficits/detail RLE Deficits / Details: Decreased strength/ROM post-op.  Able to assist with moving RLE off of bed.    Cervical / Trunk Assessment: Normal  Communication   Communication: No difficulties  Cognition Arousal/Alertness: Lethargic;Suspect due to medications Behavior During Therapy: [facility] for tasks assessed/performed Overall Cognitive Status: Within Functional Limits for tasks assessed                      General Comments      Exercises Total Joint Exercises Ankle Circles/Pumps: AROM;Both;10 reps;Seated      Assessment/Plan    PT Assessment Patient needs continued PT services  PT Diagnosis Difficulty walking;Acute pain   PT Problem List Decreased strength;Decreased range of motion;Decreased activity tolerance;Decreased balance;Decreased mobility;Decreased knowledge of  use of DME;Decreased knowledge of precautions;Pain  PT Treatment Interventions DME instruction;Gait training;Stair training;Functional mobility training;Therapeutic activities;Therapeutic exercise;Patient/family education   PT Goals (Current goals can be found in the Care Plan section) Acute Rehab PT Goals Patient Stated Goal: To go home soon PT Goal Formulation: With patient Time For Goal Achievement: 03/07/14 Potential to Achieve Goals: Good    Frequency 7X/week   Barriers to discharge   Significant other uses RW.    Co-evaluation               End of Session Equipment  Utilized During Treatment: Gait belt;Oxygen Activity Tolerance: Patient limited by lethargy Patient left: in chair;with call bell/phone within reach;with family/visitor present Nurse Communication: Mobility status (Patient in chair)         Time: 0100-7121 PT Time Calculation (min): 28 min   Charges:   PT Evaluation $Initial PT Evaluation Tier I: 1 Procedure PT Treatments $Therapeutic Activity: 23-37 mins   PT G Codes:          Despina Pole 02/28/2014, 5:23 PM Carita Pian. Sanjuana Kava, [place] Pager [phone]

## 2014-02-28 NOTE — Anesthesia Postprocedure Evaluation (Signed)
Anesthesia Post Note  Patient: Sally Garrison  Procedure(s) Performed: Procedure(s) (LRB): TOTAL KNEE ARTHROPLASTY (Right)  Anesthesia type: General  Patient location: PACU  Post pain: Pain level controlled  Post assessment: Patient's Cardiovascular Status Stable  Last Vitals:  Filed Vitals:   02/28/14 1230  BP: 138/78  Pulse: 64  Temp: 36.6 C  Resp: 15    Post vital signs: Reviewed and stable  Level of consciousness: alert  Complications: No apparent anesthesia complications

## 2014-02-28 NOTE — Progress Notes (Signed)
Orthopedic Tech Progress Note Patient Details:  Sally Garrison May 25, 1966 [id number]  CPM Right Knee CPM Right Knee: On Right Knee Flexion (Degrees): 40 Right Knee Extension (Degrees): 10   Sally Garrison 02/28/2014, 12:49 PM

## 2014-02-28 NOTE — Op Note (Signed)
PATIENT ID:      Sally Garrison  MRN:     [id number] DOB/AGE:    46-29-1967 / 46 y.o.       OPERATIVE REPORT    DATE OF PROCEDURE:  02/28/2014       PREOPERATIVE DIAGNOSIS:   OSTEOARTHRITIS RIGHT KNEE      Estimated body mass index is 36.49 kg/(m^2) as calculated from the following:   Height as of 02/15/14: 5\' 1"  (1.549 m).   Weight as of this encounter: 87.544 kg (193 lb).                                                        POSTOPERATIVE DIAGNOSIS:   OSTEOARTHRITIS RIGHT KNEE                                                                      PROCEDURE:  Procedure(s): TOTAL KNEE ARTHROPLASTY Using Depuy Sigma RP implants #3R Femur, #3Tibia, 86mm Sigma RP bearing, 35 Patella     SURGEON: Arthor Gorter J    ASSISTANT:   [employer]   (Present and scrubbed throughout the case, critical for assistance with exposure, retraction, instrumentation, and closure.)         ANESTHESIA: GET, ACB, Exparel  DRAINS: 2 medium hemovac in knee   TOURNIQUET TIME: 35TDD   COMPLICATIONS:  None     SPECIMENS: None   INDICATIONS FOR PROCEDURE: The patient has  OSTEOARTHRITIS RIGHT KNEE, varus deformities, XR shows bone on bone arthritis. Patient has failed all conservative measures including anti-inflammatory medicines, narcotics, attempts at  exercise and weight loss, cortisone injections and viscosupplementation.  Risks and benefits of surgery have been discussed, questions answered.   DESCRIPTION OF PROCEDURE: The patient identified by armband, received  IV antibiotics, in the holding area at [facility]. Patient taken to the operating room, appropriate anesthetic  monitors were attached, and general endotracheal anesthesia induced with  the patient in supine position, Foley catheter was inserted. Tourniquet  applied high to the operative thigh. Lateral post and foot positioner  applied to the table, the lower extremity was then prepped and draped  in usual sterile fashion from the  ankle to the tourniquet. Time-out procedure was performed. The limb was wrapped with an Esmarch bandage and the tourniquet inflated to 350 mmHg. We began the operation by making the anterior midline incision starting at handbreadth above the patella going over the patella 1 cm medial to and  4 cm distal to the tibial tubercle. Small bleeders in the skin and the  subcutaneous tissue identified and cauterized. Transverse retinaculum was incised and reflected medially and a medial parapatellar arthrotomy was accomplished. the patella was everted and theprepatellar fat pad resected. The superficial medial collateral  ligament was then elevated from anterior to posterior along the proximal  flare of the tibia and anterior half of the menisci resected. The knee was hyperflexed exposing bone on bone arthritis. Peripheral and notch osteophytes as well as the cruciate ligaments were then resected. We continued to  work our way around posteriorly along  the proximal tibia, and externally  rotated the tibia subluxing it out from underneath the femur. A McHale  retractor was placed through the notch and a lateral Hohmann retractor  placed, and we then drilled through the proximal tibia in line with the  axis of the tibia followed by an intramedullary guide rod and 2-degree  posterior slope cutting guide. The tibial cutting guide was pinned into place  allowing resection of 4 mm of bone medially and about 8 mm of bone  laterally because of her varus deformity. Satisfied with the tibial resection, we then  entered the distal femur 2 mm anterior to the PCL origin with the  intramedullary guide rod and applied the distal femoral cutting guide  set at 28mm, with 5 degrees of valgus. This was pinned along the  epicondylar axis. At this point, the distal femoral cut was accomplished without difficulty. We then sized for a #3R femoral component and pinned the guide in 3 degrees of external rotation.The chamfer cutting  guide was pinned into place. The anterior, posterior, and chamfer cuts were accomplished without difficulty followed by  the box cutting guide and the box cut. We also removed posterior osteophytes from the posterior femoral condyles. At this  time, the knee was brought into full extension. We checked our  extension and flexion gaps and found them symmetric at 78mm.  The patella thickness measured at 24 mm. We set the cutting guide at 15 and removed the posterior 65mm  of the patella, sized for a 35 button and drilled the lollipop. The knee  was then once again hyperflexed exposing the proximal tibia. We sized for a #3 tibial base plate, applied the smokestack and the conical reamer followed by the the Delta fin keel punch. We then hammered into place the Sigma RP trial femoral component, inserted a 10-mm trial bearing, trial patellar button, and took the knee through range of motion from 0-130 degrees. No thumb pressure was required for patellar  tracking. At this point, all trial components were removed, a double batch of DePuy HV cement with 1500 mg of Zinacef was mixed and applied to all bony metallic mating surfaces except for the posterior condyles of the femur itself. In order, we  hammered into place the tibial tray and removed excess cement, the femoral component and removed excess cement, a 10-mm Sigma RP bearing  was inserted, and the knee brought to full extension with compression.  The patellar button was clamped into place, and excess cement  removed. While the cement cured the wound was irrigated out with normal saline solution pulse lavage, and medium Hemovac drains were placed from an anterolateral  approach. Ligament stability and patellar tracking were checked and found to be excellent. The parapatellar arthrotomy was closed with  running #1 Vicryl suture. The subcutaneous tissue with 0 and 2-0 undyed  Vicryl suture, and the skin with skin staples. A dressing of Xeroform,  4 x 4,  dressing sponges, Webril, and Ace wrap applied. The patient  awakened, extubated, and taken to recovery room without difficulty.   Sylvio Weatherall J 02/28/2014, 10:39 AM

## 2014-02-28 NOTE — Transfer of Care (Signed)
Immediate Anesthesia Transfer of Care Note  Patient: Sally Garrison  Procedure(s) Performed: Procedure(s): TOTAL KNEE ARTHROPLASTY (Right)  Patient Location: PACU  Anesthesia Type:General  Level of Consciousness: awake, alert  and oriented  Airway & Oxygen Therapy: Patient Spontanous Breathing  Post-op Assessment: Report given to PACU RN  Post vital signs: Reviewed and stable  Complications: No apparent anesthesia complications

## 2014-02-28 NOTE — Progress Notes (Signed)
Utilization review completed.  

## 2014-03-01 ENCOUNTER — Encounter (HOSPITAL_COMMUNITY): Payer: Self-pay | Admitting: Orthopedic Surgery

## 2014-03-01 LAB — BASIC METABOLIC PANEL
Anion gap: 11 (ref 5–15)
BUN: 5 mg/dL — ABNORMAL LOW (ref 6–23)
CALCIUM: 8.5 mg/dL (ref 8.4–10.5)
CO2: 23 meq/L (ref 19–32)
Chloride: 102 mEq/L (ref 96–112)
Creatinine, Ser: 0.53 mg/dL (ref 0.50–1.10)
GFR calc Af Amer: >90 mL/min (ref >90)
GFR calc non Af Amer: >90 mL/min (ref >90)
GLUCOSE: 169 mg/dL — AB (ref 70–99)
POTASSIUM: 4.5 meq/L (ref 3.7–5.3)
Sodium: 136 mEq/L — ABNORMAL LOW (ref 137–147)

## 2014-03-01 LAB — CBC
HCT: 33.2 % — ABNORMAL LOW (ref 36.0–46.0)
HEMOGLOBIN: 10.2 g/dL — AB (ref 12.0–15.0)
MCH: 26.5 pg (ref 26.0–34.0)
MCHC: 30.7 g/dL (ref 30.0–36.0)
MCV: 86.2 fL (ref 78.0–100.0)
Platelets: 229 10*3/uL (ref 150–400)
RBC: 3.85 MIL/uL — AB (ref 3.87–5.11)
RDW: 14.8 % (ref 11.5–15.5)
WBC: 14.4 10*3/uL — ABNORMAL HIGH (ref 4.0–10.5)

## 2014-03-01 LAB — GLUCOSE, CAPILLARY
GLUCOSE-CAPILLARY: 123 mg/dL — AB (ref 70–99)
GLUCOSE-CAPILLARY: 111 mg/dL — AB (ref 70–99)
GLUCOSE-CAPILLARY: 124 mg/dL — AB (ref 70–99)

## 2014-03-01 NOTE — Progress Notes (Addendum)
Patient ID: Sally Garrison, female   DOB: 10-23-1965, 48 y.o.   MRN: [id number] PATIENT ID: Sally Garrison  MRN: [id number]  DOB/AGE:  June 10, 1966 / 48 y.o.  1 Day Post-Op Procedure(s) (LRB): TOTAL KNEE ARTHROPLASTY (Right)    PROGRESS NOTE Subjective: Patient is alert, oriented, no Nausea, no Vomiting, yes passing gas, no Bowel Movement. Taking PO well. Walking ito bathroom. Denies SOB, Chest or Calf Pain. Using Incentive Spirometer, PAS in place. Ambulate WBAT, CPM 0-50 Patient reports pain as 6 on 0-10 scale  .    Objective: Vital signs in last 24 hours: Filed Vitals:   02/28/14 1230 02/28/14 2111 03/01/14 0112 03/01/14 0552  BP: 138/78 132/91 133/79 136/69  Pulse: 64 58 57 52  Temp: 97.9 F (36.6 C) 97.6 F (36.4 C) 98.3 F (36.8 C) 97.5 F (36.4 C)  TempSrc:  Oral Oral Oral  Resp: 15 16 16 16   Weight:      SpO2: 98% 98% 98% 97%      Intake/Output from previous day: I/O last 3 completed shifts: In: 2640 [P.O.:240; I.V.:2400] Out: 225 [Drains:125; Blood:100]   Intake/Output this shift:     LABORATORY DATA:  Recent Labs  02/28/14 1703 02/28/14 2112 03/01/14 0427 03/01/14 0622  WBC  --   --  14.4*  --   HGB  --   --  10.2*  --   HCT  --   --  33.2*  --   PLT  --   --  229  --   NA  --   --  136*  --   K  --   --  4.5  --   CL  --   --  102  --   CO2  --   --  23  --   BUN  --   --  5*  --   CREATININE  --   --  0.53  --   GLUCOSE  --   --  169*  --   GLUCAP 227* 153*  --  123*  CALCIUM  --   --  8.5  --     Examination: Neurologically intact ABD soft Neurovascular intact Sensation intact distally Intact pulses distally Dorsiflexion/Plantar flexion intact Incision: dressing C/D/I No cellulitis present Compartment soft} Blood and plasma separated in drain indicating minimal recent drainage, drain pulled without difficulty.  Assessment:   1 Day Post-Op Procedure(s) (LRB): TOTAL KNEE ARTHROPLASTY (Right) ADDITIONAL DIAGNOSIS:    Plan: PT/OT WBAT, CPM  5/hrs day until ROM 0-90 degrees, then D/C CPM DVT Prophylaxis:  SCDx72hrs, ASA 325 mg BID x 2 weeks DISCHARGE PLAN: Home, 1-2 days DISCHARGE NEEDS: HHPT, HHRN, CPM, Walker and 3-in-1 comode seat     Corvette Orser J 03/01/2014, 7:11 AM

## 2014-03-01 NOTE — Progress Notes (Signed)
Orthopedic Tech Progress Note Patient Details:  Sally Garrison 10/26/1965 [id number] On cpm at 8:05 pm RLE 0-60 Patient ID: Sally Garrison, female   DOB: 10/27/1965, 48 y.o.   MRN: [id number]   Braulio Bosch 03/01/2014, 8:07 PM

## 2014-03-01 NOTE — Progress Notes (Signed)
Physical Therapy Treatment Patient Details Name: Sally Garrison MRN: [id number] DOB: 08/02/1966 Today's Date: 03/01/2014    History of Present Illness [name] admitted 02/28/14 now s/p Rt TKA.  PMH:  sleep apnea, DM, anxiety, depression, CKD, migraines, anemia    PT Comments    Pt doing great this morning and able to perform stair gait.  Feel pt will continue to make great progress and is ready for D/C from PT stand point.    Follow Up Recommendations  Home health PT;Supervision/Assistance - 24 hour     Equipment Recommendations  None recommended by PT    Recommendations for Other Services       Precautions / Restrictions Precautions Precautions: Knee;Fall Precaution Booklet Issued: Yes (comment) Precaution Comments: Reviewed precautions with patient. Restrictions Weight Bearing Restrictions: Yes RLE Weight Bearing: Weight bearing as tolerated    Mobility  Bed Mobility Overal bed mobility: Needs Assistance Bed Mobility: Supine to Sit     Supine to sit: Supervision     General bed mobility comments: Demos good technique.  Needs increased time.    Transfers Overall transfer level: Needs assistance Equipment used: Rolling walker (2 wheeled) Transfers: Sit to/from Stand Sit to Stand: Supervision         General transfer comment: cues for UE use.  No physical A needed from bed or toilet.    Ambulation/Gait Ambulation/Gait assistance: Supervision Ambulation Distance (Feet): 120 Feet (and 200) Assistive device: Rolling walker (2 wheeled) Gait Pattern/deviations: Step-to pattern;Decreased step length - left;Decreased stance time - right     General Gait Details: cues for upright posture and positioning within RW.     Stairs Stairs: Yes Stairs assistance: Supervision Stair Management: Two rails;Step to pattern;Forwards Number of Stairs: 5 General stair comments: pt demos good technique and previous knowlegde of stair gait sequencing.     Wheelchair Mobility    Modified Rankin (Stroke Patients Only)       Balance                                    Cognition Arousal/Alertness: Lethargic;Suspect due to medications Behavior During Therapy: [facility] for tasks assessed/performed Overall Cognitive Status: Within Functional Limits for tasks assessed                      Exercises Total Joint Exercises Long Arc Quad: AROM;Right;10 reps Knee Flexion: AROM;Right;10 reps Goniometric ROM: ~10 - 65    General Comments        Pertinent Vitals/Pain 8/10 after pain meds.  RN gave IV meds at beginning of session.      Home Living                      Prior Function            PT Goals (current goals can now be found in the care plan section) Acute Rehab PT Goals Patient Stated Goal: To go home soon Time For Goal Achievement: 03/07/14 Potential to Achieve Goals: Good Progress towards PT goals: Progressing toward goals    Frequency  7X/week    PT Plan Current plan remains appropriate    Co-evaluation             End of Session Equipment Utilized During Treatment: Gait belt Activity Tolerance: Patient tolerated treatment well Patient left: in chair;with call bell/phone within reach;with family/visitor present  Time: 0810-0852 PT Time Calculation (min): 42 min  Charges:  $Gait Training: 23-37 mins $Therapeutic Exercise: 8-22 mins                    G CodesCatarina Garrison, [place] 03/01/2014, 10:24 AM

## 2014-03-01 NOTE — Evaluation (Signed)
Agree with note. 11:11-11:53 Francesca Oman 03/01/2014 Nestor Lewandowsky, OTR/L Pager: [phone]

## 2014-03-01 NOTE — Care Management Note (Signed)
CARE MANAGEMENT NOTE 03/01/2014  Patient:  YEHUDIT, FULGINITI   Account Number:  [id number]  Date Initiated:  03/01/2014  Documentation initiated by:  Ricki Miller  Subjective/Objective Assessment:   48 yr old female s/p right total knee arthroplasty.     Action/Plan:   Case manager spoke with patient concerning Home health and DME needs at discharge. CM received permission to arrange for HHPT. Referral called to Rhett Bannister, [place] liaison.   Anticipated DC Date:  03/02/2014   Anticipated DC Plan:  [place]  CM consult      [facility] Choice  HOME HEALTH  DURABLE MEDICAL EQUIPMENT   Choice offered to / List presented to:  C-1 Patient   DME arranged  3-N-1  [place]  CPM      DME agency  [employer]     [place] arranged  HH-2 PT      [place]   Status of service:  Completed, signed off Medicare Important Message given?   (If response is "NO", the following Medicare IM given date fields will be blank) Date Medicare IM given:   Medicare IM given by:   Date Additional Medicare IM given:   Additional Medicare IM given by:    Discharge Disposition:  [place]  Per UR Regulation:  Reviewed for med. necessity/level of care/duration of stay  If discussed at [place] of Stay Meetings, dates discussed:    Comments:

## 2014-03-01 NOTE — Evaluation (Signed)
Occupational Therapy Evaluation and Discharge Patient Details Name: Sally Garrison MRN: [id number] DOB: Feb 08, 1966 Today's Date: 03/01/2014    History of Present Illness [name] admitted 02/28/14 now s/p Rt TKA.  PMH:  sleep apnea, DM, anxiety, depression, CKD, migraines, anemia   Clinical Impression   Pta pt was independent with self care tasks and now from above presents with acute pain, generalized weakness and limited ROM interfering with her independence with ADLs. Educated pt on LB ADL technique and sequence and AE to assist with those tasks, the multiple uses of the 3in1 and how to do a tub transfer using the 3in1. Pt was issued a reacher and a long handled sponge and pt has all other DME needs. Pt feels confident in self care tasks and will have 24/7 supervision to assist with IADL/ADLs as needed at home. No further OT is needed, we will sign off.    Follow Up Recommendations  No OT follow up;Supervision/Assistance - 24 hour    Equipment Recommendations   (Pt has all DME needs)       Precautions / Restrictions Precautions Precautions: Knee;Fall Precaution Booklet Issued: Yes (comment) Precaution Comments: Reviewed precautions with patient. Restrictions Weight Bearing Restrictions: Yes RLE Weight Bearing: Weight bearing as tolerated      Mobility Bed Mobility     General bed mobility comments: Up in chair upon OT tx.  Transfers Overall transfer level: Needs assistance Equipment used: Rolling walker (2 wheeled) Transfers: Sit to/from Stand Sit to Stand: Supervision         General transfer comment: VC for UE use and hand placement but no physical A needed    Balance Overall balance assessment: Needs assistance Sitting-balance support: No upper extremity supported Sitting balance-Leahy Scale: Good       Standing balance-Leahy Scale: Fair Standing balance comment: able to manage 3in1 during tub transfer without physical assistance                              ADL Overall ADL's : Needs assistance/impaired Eating/Feeding: Independent;Sitting   Grooming: Set up;Sitting   Upper Body Bathing: Set up;Sitting   Lower Body Bathing: Sit to/from stand;Set up;With adaptive equipment   Upper Body Dressing : Sitting;Set up   Lower Body Dressing: Sit to/from stand;Set up;With adaptive equipment   Toilet Transfer: Supervision/safety   Toileting- Water quality scientist and Hygiene: Supervision/safety;Sit to/from stand   Tub/ Shower Transfer: Tub transfer;Min guard;Ambulation;3 in 1;Rolling walker   Functional mobility during ADLs: Supervision/safety;Rolling walker General ADL Comments: Pt demonstrated LB dressing by proping RLE onto bed and reaching down feet. Pt practiced LB ADLs with AE and would benefit from long handled sponge and reacher. Educated and had pt demonstrate tub transfer technique using 3in1 and husband will be at home with her 24/7 to move [facility] as needed.               Pertinent Vitals/Pain Pt c/o pain during tx but had just received pain medicine prior to therapy. At the end of tx, pt stated she was feeling much better and that the pain medicine was starting to take effect.        Extremity/Trunk Assessment Upper Extremity Assessment Upper Extremity Assessment: Overall WFL for tasks assessed   Lower Extremity Assessment Lower Extremity Assessment: Defer to PT evaluation   Cervical / Trunk Assessment Cervical / Trunk Assessment: Normal   Communication Communication Communication: No difficulties   Cognition  Arousal/Alertness: Awake/alert Behavior During Therapy: WFL for tasks assessed/performed Overall Cognitive Status: Within Functional Limits for tasks assessed                                Home Living Family/patient expects to be discharged to:: Private residence Living Arrangements: Spouse/significant other;Children Available Help at Discharge: Family;Available 24  hours/day Type of Home: Mobile home Home Access: Stairs to enter Entrance Stairs-Number of Steps: 4 Entrance Stairs-Rails: Left;Right Home Layout: One level     Bathroom Shower/Tub: Tub/shower unit Shower/tub characteristics: Curtain Biochemist, clinical: Standard     Home Equipment: Environmental consultant - 2 wheels;Bedside commode;Hand held shower head          Prior Functioning/Environment Level of Independence: Independent                      OT Goals(Current goals can be found in the care plan section) Acute Rehab OT Goals Patient Stated Goal: To go home soon                End of Session Equipment Utilized During Treatment: Rolling walker CPM Right Knee CPM Right Knee: Off  Activity Tolerance: Patient tolerated treatment well Patient left: in chair;with call bell/phone within reach;with family/visitor present   Time:  -    Charges:    G-CodesLyda Perone March 12, 2014, 12:09 PM

## 2014-03-01 NOTE — Progress Notes (Signed)
Physical Therapy Treatment Patient Details Name: Sally Garrison MRN: [id number] DOB: 06-14-1966 Today's Date: 03/01/2014    History of Present Illness [name] admitted 02/28/14 now s/p Rt TKA.  PMH:  sleep apnea, DM, anxiety, depression, CKD, migraines, anemia    PT Comments    Pt moving well despite c/o increased pain this pm.  Pt ready for D/C from PT stand point.  Will continue to follow while on acute.    Follow Up Recommendations  Home health PT;Supervision/Assistance - 24 hour     Equipment Recommendations  None recommended by PT    Recommendations for Other Services       Precautions / Restrictions Precautions Precautions: Knee;Fall Precaution Booklet Issued: Yes (comment) Precaution Comments: Reviewed precautions with patient. Restrictions Weight Bearing Restrictions: Yes RLE Weight Bearing: Weight bearing as tolerated    Mobility  Bed Mobility Overal bed mobility: Needs Assistance Bed Mobility: Supine to Sit;Sit to Supine     Supine to sit: Supervision Sit to supine: Min assist   General bed mobility comments: A to bring R LE up in to bed.    Transfers Overall transfer level: Needs assistance Equipment used: Rolling walker (2 wheeled) Transfers: Sit to/from Stand Sit to Stand: Supervision         General transfer comment: VC for UE use and hand placement but no physical A needed  Ambulation/Gait Ambulation/Gait assistance: Supervision Ambulation Distance (Feet): 150 Feet Assistive device: Rolling walker (2 wheeled) Gait Pattern/deviations: Step-through pattern;Decreased step length - left;Decreased stance time - right;Trunk flexed     General Gait Details: cues for upright posture and positioning within RW.     Stairs            Wheelchair Mobility    Modified Rankin (Stroke Patients Only)       Balance Overall balance assessment: Needs assistance Sitting-balance support: No upper extremity supported Sitting  balance-Leahy Scale: Good       Standing balance-Leahy Scale: Fair Standing balance comment: able to manage 3in1 during tub transfer without UE support                    Cognition Arousal/Alertness: Awake/alert Behavior During Therapy: WFL for tasks assessed/performed Overall Cognitive Status: Within Functional Limits for tasks assessed                      Exercises Total Joint Exercises Long Arc Quad: AROM;Right;10 reps Knee Flexion: AROM;Right;10 reps    General Comments        Pertinent Vitals/Pain Pt indicates pain worse this pm, but states she has already taken her pain meds.      Home Living Family/patient expects to be discharged to:: Private residence Living Arrangements: Spouse/significant other;Children Available Help at Discharge: Family;Available 24 hours/day Type of Home: Mobile home Home Access: Stairs to enter Entrance Stairs-Rails: Left;Right Home Layout: One level Home Equipment: Environmental consultant - 2 wheels;Bedside commode;Hand held shower head      Prior Function Level of Independence: Independent          PT Goals (current goals can now be found in the care plan section) Acute Rehab PT Goals Patient Stated Goal: To go home soon PT Goal Formulation: With patient Time For Goal Achievement: 03/07/14 Potential to Achieve Goals: Good Progress towards PT goals: Progressing toward goals    Frequency  7X/week    PT Plan Current plan remains appropriate    Co-evaluation  End of Session Equipment Utilized During Treatment: Gait belt Activity Tolerance: Patient tolerated treatment well Patient left: in bed;in CPM;with call bell/phone within reach     Time: 1319-1345 PT Time Calculation (min): 26 min  Charges:  $Gait Training: 8-22 mins $Therapeutic Exercise: 8-22 mins                    G CodesCatarina Hartshorn, [place] 03/01/2014, 2:02 PM

## 2014-03-02 LAB — CBC
HCT: 32.7 % — ABNORMAL LOW (ref 36.0–46.0)
Hemoglobin: 10.3 g/dL — ABNORMAL LOW (ref 12.0–15.0)
MCH: 26.9 pg (ref 26.0–34.0)
MCHC: 31.5 g/dL (ref 30.0–36.0)
MCV: 85.4 fL (ref 78.0–100.0)
Platelets: 216 10*3/uL (ref 150–400)
RBC: 3.83 MIL/uL — ABNORMAL LOW (ref 3.87–5.11)
RDW: 15.2 % (ref 11.5–15.5)
WBC: 10.2 10*3/uL (ref 4.0–10.5)

## 2014-03-02 NOTE — Progress Notes (Signed)
Physical Therapy Treatment Patient Details Name: Sally Garrison MRN: [id number] DOB: [birth year] Today's Date: 03/02/2014    History of Present Illness [name] admitted 02/28/14 now s/p Rt TKA.  PMH:  sleep apnea, DM, anxiety, depression, CKD, migraines, anemia    PT Comments    Pt moving well today.  Pt able to perform toilet transfer and hygiene Mod I.  Pt ready for D/C from PT stand point.    Follow Up Recommendations  Home health PT;Supervision/Assistance - 24 hour     Equipment Recommendations  None recommended by PT    Recommendations for Other Services       Precautions / Restrictions Precautions Precautions: Knee;Fall Precaution Booklet Issued: Yes (comment) Precaution Comments: Reviewed precautions with patient. Restrictions Weight Bearing Restrictions: Yes RLE Weight Bearing: Weight bearing as tolerated    Mobility  Bed Mobility Overal bed mobility: Modified Independent             General bed mobility comments: Increased time needed, but able to complete without A.    Transfers Overall transfer level: Needs assistance Equipment used: Rolling walker (2 wheeled) Transfers: Sit to/from Stand Sit to Stand: Modified independent (Device/Increase time)         General transfer comment: Demos good technique.  No A needed.    Ambulation/Gait Ambulation/Gait assistance: Supervision Ambulation Distance (Feet): 200 Feet Assistive device: Rolling walker (2 wheeled) Gait Pattern/deviations: Step-through pattern;Decreased step length - left;Decreased stance time - right     General Gait Details: pt able to increase amb distance today.     Stairs         General stair comments: pt declined need to practice stairs again.    Wheelchair Mobility    Modified Rankin (Stroke Patients Only)       Balance                                    Cognition Arousal/Alertness: Awake/alert Behavior During Therapy: WFL for tasks  assessed/performed Overall Cognitive Status: Within Functional Limits for tasks assessed                      Exercises Total Joint Exercises Long Arc Quad: AROM;Right;10 reps Knee Flexion: AROM;Right;10 reps Goniometric ROM: 10 - 65    General Comments        Pertinent Vitals/Pain 7/10.  Premedicated.      Home Living                      Prior Function            PT Goals (current goals can now be found in the care plan section) Acute Rehab PT Goals Patient Stated Goal: To go home soon Time For Goal Achievement: 03/07/14 Potential to Achieve Goals: Good Progress towards PT goals: Progressing toward goals    Frequency  7X/week    PT Plan Current plan remains appropriate    Co-evaluation             End of Session   Activity Tolerance: Patient tolerated treatment well Patient left: in chair;with call bell/phone within reach;with family/visitor present     Time: 0757-0822 PT Time Calculation (min): 25 min  Charges:  $Gait Training: 8-22 mins $Therapeutic Activity: 8-22 mins                    G  CodesCatarina Hartshorn, [place] 03/02/2014, 11:13 AM

## 2014-03-02 NOTE — Discharge Summary (Signed)
Patient ID: Sally Garrison MRN: [id number] DOB/AGE: Jul 06, 1966 48 y.o.  Admit date: 02/28/2014 Discharge date: 03/02/2014  Admission Diagnoses:  Principal Problem:   Arthritis of knee, right Active Problems:   Arthritis of knee   Discharge Diagnoses:  Same  Past Medical History  Diagnosis Date  . PONV (postoperative nausea and vomiting)   . Sleep apnea     not using CPAP  . Shortness of breath   . Bronchitis   . Diabetes mellitus without complication     had gastric by-pass not on meds  . Depression   . Anxiety   . Chronic kidney disease     kidney stones  . GERD (gastroesophageal reflux disease)   . Headache(784.0)     migraines  . Arthritis   . Cancer     cervical Cancer  . Anemia     on monthly period since June    Surgeries: Procedure(s): TOTAL KNEE ARTHROPLASTY on 02/28/2014   Consultants:    Discharged Condition: Improved  Hospital Course: Sally Garrison is an 48 y.o. female who was admitted 02/28/2014 for operative treatment ofArthritis of knee, right. Patient has severe unremitting pain that affects sleep, daily activities, and work/hobbies. After pre-op clearance the patient was taken to the operating room on 02/28/2014 and underwent  Procedure(s): TOTAL KNEE ARTHROPLASTY.    Patient was given perioperative antibiotics: Anti-infectives   Start     Dose/Rate Route Frequency Ordered Stop   02/28/14 0815  clindamycin (CLEOCIN) IVPB 600 mg  Status:  Discontinued     600 mg 100 mL/hr over 30 Minutes Intravenous  Once 02/28/14 0811 02/28/14 1226   02/28/14 0809  clindamycin (CLEOCIN) 600 MG/50ML IVPB    Comments:  Leandrew Koyanagi   : cabinet override      02/28/14 0809 02/28/14 0938   02/28/14 0750  ceFAZolin (ANCEF) 2-3 GM-% IVPB SOLR    Comments:  Ardine Eng   : cabinet override      02/28/14 0750 02/28/14 1959       Patient was given sequential compression devices, early ambulation, and chemoprophylaxis to prevent DVT.  Patient benefited maximally  from hospital stay and there were no complications.    Recent vital signs: Patient Vitals for the past 24 hrs:  BP Temp Temp src Pulse Resp SpO2  03/02/14 0542 129/80 mmHg 98 F (36.7 C) Oral 80 18 94 %  03/02/14 0400 - - - - 18 96 %  03/02/14 0000 - - - - 18 96 %  03/01/14 2100 136/72 mmHg 98.7 F (37.1 C) - 77 18 96 %  03/01/14 2000 - - - - 18 96 %  03/01/14 1241 129/92 mmHg 98.1 F (36.7 C) Axillary 69 18 96 %     Recent laboratory studies:  Recent Labs  03/01/14 0427 03/02/14 0528  WBC 14.4* 10.2  HGB 10.2* 10.3*  HCT 33.2* 32.7*  PLT 229 216  NA 136*  --   K 4.5  --   CL 102  --   CO2 23  --   BUN 5*  --   CREATININE 0.53  --   GLUCOSE 169*  --   CALCIUM 8.5  --      Discharge Medications:     Medication List    STOP taking these medications       HYDROcodone-acetaminophen 10-325 MG per tablet  Commonly known as:  NORCO      TAKE these medications       acetaminophen 325 MG  tablet  Commonly known as:  TYLENOL  Take 650 mg by mouth every 6 (six) hours as needed for mild pain, fever or headache.     aspirin EC 325 MG tablet  Take 1 tablet (325 mg total) by mouth 2 (two) times daily.     cyclobenzaprine 10 MG tablet  Commonly known as:  FLEXERIL  Take 10 mg by mouth 3 (three) times daily as needed for muscle spasms.     FLUoxetine 40 MG capsule  Commonly known as:  PROZAC  Take 40 mg by mouth daily.     meloxicam 15 MG tablet  Commonly known as:  MOBIC  Take 15 mg by mouth daily.     omeprazole 40 MG capsule  Commonly known as:  PRILOSEC  Take 40 mg by mouth daily.     oxyCODONE-acetaminophen 5-325 MG per tablet  Commonly known as:  ROXICET  Take 1 tablet by mouth every 4 (four) hours as needed.        Diagnostic Studies: Dg Chest 2 View  02/15/2014   CLINICAL DATA:  Preoperative evaluation for knee replacement  EXAM: CHEST  2 VIEW  COMPARISON:  None.  FINDINGS: The heart size and mediastinal contours are within normal limits. Both lungs  are clear. The visualized skeletal structures are unremarkable. Postsurgical changes are noted in the upper abdomen.  IMPRESSION: No acute abnormality seen.   Electronically Signed   By: Inez Catalina M.D.   On: 02/15/2014 17:15    Disposition: Final discharge disposition not confirmed      Discharge Instructions   CPM    Complete by:  As directed   Continuous passive motion machine (CPM):      Use the CPM from 0 to 60  for 5 hours per day.      You may increase by 10 degrees per day.  You may break it up into 2 or 3 sessions per day.      Use CPM for 2 weeks or until you are told to stop.     Call MD / Call 911    Complete by:  As directed   If you experience chest pain or shortness of breath, CALL 911 and be transported to the hospital emergency room.  If you develope a fever above 101 F, pus (white drainage) or increased drainage or redness at the wound, or calf pain, call your surgeon's office.     Change dressing    Complete by:  As directed   Change dressing on 5, then change the dressing daily with sterile 4 x 4 inch gauze dressing and apply TED hose.  You may clean the incision with alcohol prior to redressing.     Constipation Prevention    Complete by:  As directed   Drink plenty of fluids.  Prune juice may be helpful.  You may use a stool softener, such as Colace (over the counter) 100 mg twice a day.  Use MiraLax (over the counter) for constipation as needed.     Diet - low sodium heart healthy    Complete by:  As directed      Discharge instructions    Complete by:  As directed   Follow up in office with Dr. Mayer Camel in 2 weeks.     Driving restrictions    Complete by:  As directed   No driving for 2 weeks     Increase activity slowly as tolerated    Complete by:  As directed  Patient may shower    Complete by:  As directed   You may shower without a dressing once there is no drainage.  Do not wash over the wound.  If drainage remains, cover wound with plastic wrap and  then shower.           Follow-up Information   Follow up with Kerin Salen, MD In 2 weeks.   Specialty:  Orthopedic Surgery   Contact information:   [place] [id number] [phone]       Follow up with [facility]. (Someone from [facility] home [facility] will contact you concerning start date and time for physical therapy.)    Specialty:  [place] information:   [place] [id number] [phone]       Follow up with Kerin Salen, MD In 2 weeks.   Specialty:  Orthopedic Surgery   Contact information:   [place] [address] [phone]        Signed: Hardin Negus, Louella Medaglia R 03/02/2014, 8:08 AM

## 2014-03-02 NOTE — Progress Notes (Signed)
PATIENT ID: Sally Garrison  MRN: [id number]  DOB/AGE:  02/28/1966 / 47 y.o.  2 Days Post-Op Procedure(s) (LRB): TOTAL KNEE ARTHROPLASTY (Right)    PROGRESS NOTE Subjective: Patient is alert, oriented, no Nausea, no Vomiting, yes passing gas, no Bowel Movement. Taking PO well. Denies SOB, Chest or Calf Pain. Using Incentive Spirometer, PAS in place. Ambulate WBAT, CPM 0-60.  Pt walked 150 ft and practiced steps yesterday. Patient reports pain as moderate  .    Objective: Vital signs in last 24 hours: Filed Vitals:   03/01/14 2100 03/02/14 0000 03/02/14 0400 03/02/14 0542  BP: 136/72   129/80  Pulse: 77   80  Temp: 98.7 F (37.1 C)   98 F (36.7 C)  TempSrc:    Oral  Resp: 18 18 18 18   Weight:      SpO2: 96% 96% 96% 94%      Intake/Output from previous day: I/O last 3 completed shifts: In: 2700 [P.O.:1200; I.V.:1500] Out: 125 [Drains:125]   Intake/Output this shift:     LABORATORY DATA:  Recent Labs  03/01/14 0427 03/01/14 0622 03/01/14 1150 03/01/14 2147 03/02/14 0528  WBC 14.4*  --   --   --  10.2  HGB 10.2*  --   --   --  10.3*  HCT 33.2*  --   --   --  32.7*  PLT 229  --   --   --  216  NA 136*  --   --   --   --   K 4.5  --   --   --   --   CL 102  --   --   --   --   CO2 23  --   --   --   --   BUN 5*  --   --   --   --   CREATININE 0.53  --   --   --   --   GLUCOSE 169*  --   --   --   --   GLUCAP  --  123* 111* 124*  --   CALCIUM 8.5  --   --   --   --     Examination: Neurologically intact Neurovascular intact Sensation intact distally Intact pulses distally Dorsiflexion/Plantar flexion intact Incision: dressing C/D/I and no drainage No cellulitis present Compartment soft}  Assessment:   2 Days Post-Op Procedure(s) (LRB): TOTAL KNEE ARTHROPLASTY (Right) ADDITIONAL DIAGNOSIS:   Plan: PT/OT WBAT, CPM 5/hrs day until ROM 0-90 degrees, then D/C CPM DVT Prophylaxis:  SCDx72hrs, ASA 325 mg BID x 2 weeks DISCHARGE PLAN: Home today DISCHARGE  NEEDS: HHPT, HHRN, CPM, Walker and 3-in-1 comode seat     Adreona Brand R 03/02/2014, 8:03 AM

## 2014-12-06 DIAGNOSIS — D519 Vitamin B12 deficiency anemia, unspecified: Secondary | ICD-10-CM | POA: Diagnosis not present

## 2014-12-12 DIAGNOSIS — D519 Vitamin B12 deficiency anemia, unspecified: Secondary | ICD-10-CM | POA: Diagnosis not present

## 2014-12-13 DIAGNOSIS — N939 Abnormal uterine and vaginal bleeding, unspecified: Secondary | ICD-10-CM | POA: Diagnosis not present

## 2014-12-13 DIAGNOSIS — N859 Noninflammatory disorder of uterus, unspecified: Secondary | ICD-10-CM | POA: Diagnosis not present

## 2014-12-13 DIAGNOSIS — Z01419 Encounter for gynecological examination (general) (routine) without abnormal findings: Secondary | ICD-10-CM | POA: Diagnosis not present

## 2014-12-13 DIAGNOSIS — Z1389 Encounter for screening for other disorder: Secondary | ICD-10-CM | POA: Diagnosis not present

## 2014-12-13 DIAGNOSIS — Z1231 Encounter for screening mammogram for malignant neoplasm of breast: Secondary | ICD-10-CM | POA: Diagnosis not present

## 2014-12-13 DIAGNOSIS — Z130 Encounter for screening for diseases of the blood and blood-forming organs and certain disorders involving the immune mechanism: Secondary | ICD-10-CM | POA: Diagnosis not present

## 2014-12-14 DIAGNOSIS — J301 Allergic rhinitis due to pollen: Secondary | ICD-10-CM | POA: Diagnosis not present

## 2014-12-14 DIAGNOSIS — E559 Vitamin D deficiency, unspecified: Secondary | ICD-10-CM | POA: Diagnosis not present

## 2014-12-14 DIAGNOSIS — J4520 Mild intermittent asthma, uncomplicated: Secondary | ICD-10-CM | POA: Diagnosis not present

## 2014-12-14 DIAGNOSIS — R5383 Other fatigue: Secondary | ICD-10-CM | POA: Diagnosis not present

## 2014-12-14 DIAGNOSIS — G4733 Obstructive sleep apnea (adult) (pediatric): Secondary | ICD-10-CM | POA: Diagnosis not present

## 2014-12-20 DIAGNOSIS — D510 Vitamin B12 deficiency anemia due to intrinsic factor deficiency: Secondary | ICD-10-CM | POA: Diagnosis not present

## 2014-12-29 DIAGNOSIS — D518 Other vitamin B12 deficiency anemias: Secondary | ICD-10-CM | POA: Diagnosis not present

## 2015-01-05 DIAGNOSIS — W57XXXA Bitten or stung by nonvenomous insect and other nonvenomous arthropods, initial encounter: Secondary | ICD-10-CM | POA: Diagnosis not present

## 2015-01-05 DIAGNOSIS — Z6835 Body mass index (BMI) 35.0-35.9, adult: Secondary | ICD-10-CM | POA: Diagnosis not present

## 2015-01-05 DIAGNOSIS — M5442 Lumbago with sciatica, left side: Secondary | ICD-10-CM | POA: Diagnosis not present

## 2015-01-30 DIAGNOSIS — D510 Vitamin B12 deficiency anemia due to intrinsic factor deficiency: Secondary | ICD-10-CM | POA: Diagnosis not present

## 2015-02-23 DIAGNOSIS — Z711 Person with feared health complaint in whom no diagnosis is made: Secondary | ICD-10-CM | POA: Diagnosis not present

## 2015-02-23 DIAGNOSIS — G4733 Obstructive sleep apnea (adult) (pediatric): Secondary | ICD-10-CM | POA: Diagnosis not present

## 2015-03-02 DIAGNOSIS — Z6836 Body mass index (BMI) 36.0-36.9, adult: Secondary | ICD-10-CM | POA: Diagnosis not present

## 2015-03-02 DIAGNOSIS — D539 Nutritional anemia, unspecified: Secondary | ICD-10-CM | POA: Diagnosis not present

## 2015-03-02 DIAGNOSIS — M5432 Sciatica, left side: Secondary | ICD-10-CM | POA: Diagnosis not present

## 2015-03-02 DIAGNOSIS — D510 Vitamin B12 deficiency anemia due to intrinsic factor deficiency: Secondary | ICD-10-CM | POA: Diagnosis not present

## 2015-03-02 DIAGNOSIS — F329 Major depressive disorder, single episode, unspecified: Secondary | ICD-10-CM | POA: Diagnosis not present

## 2015-03-03 DIAGNOSIS — J301 Allergic rhinitis due to pollen: Secondary | ICD-10-CM | POA: Diagnosis not present

## 2015-03-03 DIAGNOSIS — G2581 Restless legs syndrome: Secondary | ICD-10-CM | POA: Diagnosis not present

## 2015-03-03 DIAGNOSIS — J4520 Mild intermittent asthma, uncomplicated: Secondary | ICD-10-CM | POA: Diagnosis not present

## 2015-03-03 DIAGNOSIS — R5383 Other fatigue: Secondary | ICD-10-CM | POA: Diagnosis not present

## 2015-03-17 DIAGNOSIS — M5432 Sciatica, left side: Secondary | ICD-10-CM | POA: Diagnosis not present

## 2015-03-17 DIAGNOSIS — M5126 Other intervertebral disc displacement, lumbar region: Secondary | ICD-10-CM | POA: Diagnosis not present

## 2015-03-17 DIAGNOSIS — M47817 Spondylosis without myelopathy or radiculopathy, lumbosacral region: Secondary | ICD-10-CM | POA: Diagnosis not present

## 2015-03-30 DIAGNOSIS — M2560 Stiffness of unspecified joint, not elsewhere classified: Secondary | ICD-10-CM | POA: Diagnosis not present

## 2015-03-30 DIAGNOSIS — M542 Cervicalgia: Secondary | ICD-10-CM | POA: Diagnosis not present

## 2015-03-30 DIAGNOSIS — R2689 Other abnormalities of gait and mobility: Secondary | ICD-10-CM | POA: Diagnosis not present

## 2015-03-30 DIAGNOSIS — M5432 Sciatica, left side: Secondary | ICD-10-CM | POA: Diagnosis not present

## 2015-04-03 DIAGNOSIS — Z6835 Body mass index (BMI) 35.0-35.9, adult: Secondary | ICD-10-CM | POA: Diagnosis not present

## 2015-04-03 DIAGNOSIS — R5382 Chronic fatigue, unspecified: Secondary | ICD-10-CM | POA: Diagnosis not present

## 2015-04-03 DIAGNOSIS — D509 Iron deficiency anemia, unspecified: Secondary | ICD-10-CM | POA: Diagnosis not present

## 2015-04-03 DIAGNOSIS — F329 Major depressive disorder, single episode, unspecified: Secondary | ICD-10-CM | POA: Diagnosis not present

## 2015-04-03 DIAGNOSIS — D510 Vitamin B12 deficiency anemia due to intrinsic factor deficiency: Secondary | ICD-10-CM | POA: Diagnosis not present

## 2015-04-03 DIAGNOSIS — M545 Low back pain: Secondary | ICD-10-CM | POA: Diagnosis not present

## 2015-04-05 DIAGNOSIS — M2560 Stiffness of unspecified joint, not elsewhere classified: Secondary | ICD-10-CM | POA: Diagnosis not present

## 2015-04-05 DIAGNOSIS — M5432 Sciatica, left side: Secondary | ICD-10-CM | POA: Diagnosis not present

## 2015-04-05 DIAGNOSIS — R2689 Other abnormalities of gait and mobility: Secondary | ICD-10-CM | POA: Diagnosis not present

## 2015-04-05 DIAGNOSIS — M542 Cervicalgia: Secondary | ICD-10-CM | POA: Diagnosis not present

## 2015-04-11 DIAGNOSIS — M545 Low back pain: Secondary | ICD-10-CM | POA: Diagnosis not present

## 2015-04-11 DIAGNOSIS — G43119 Migraine with aura, intractable, without status migrainosus: Secondary | ICD-10-CM | POA: Diagnosis not present

## 2015-04-11 DIAGNOSIS — R2689 Other abnormalities of gait and mobility: Secondary | ICD-10-CM | POA: Diagnosis not present

## 2015-04-11 DIAGNOSIS — M2560 Stiffness of unspecified joint, not elsewhere classified: Secondary | ICD-10-CM | POA: Diagnosis not present

## 2015-04-11 DIAGNOSIS — M5432 Sciatica, left side: Secondary | ICD-10-CM | POA: Diagnosis not present

## 2015-04-18 DIAGNOSIS — M545 Low back pain: Secondary | ICD-10-CM | POA: Diagnosis not present

## 2015-04-18 DIAGNOSIS — M2560 Stiffness of unspecified joint, not elsewhere classified: Secondary | ICD-10-CM | POA: Diagnosis not present

## 2015-04-18 DIAGNOSIS — R2689 Other abnormalities of gait and mobility: Secondary | ICD-10-CM | POA: Diagnosis not present

## 2015-04-18 DIAGNOSIS — M5432 Sciatica, left side: Secondary | ICD-10-CM | POA: Diagnosis not present

## 2015-04-20 DIAGNOSIS — M5432 Sciatica, left side: Secondary | ICD-10-CM | POA: Diagnosis not present

## 2015-04-20 DIAGNOSIS — M545 Low back pain: Secondary | ICD-10-CM | POA: Diagnosis not present

## 2015-04-20 DIAGNOSIS — M2560 Stiffness of unspecified joint, not elsewhere classified: Secondary | ICD-10-CM | POA: Diagnosis not present

## 2015-04-20 DIAGNOSIS — R2689 Other abnormalities of gait and mobility: Secondary | ICD-10-CM | POA: Diagnosis not present

## 2015-04-26 DIAGNOSIS — J301 Allergic rhinitis due to pollen: Secondary | ICD-10-CM | POA: Diagnosis not present

## 2015-05-04 DIAGNOSIS — M545 Low back pain: Secondary | ICD-10-CM | POA: Diagnosis not present

## 2015-05-04 DIAGNOSIS — Z6835 Body mass index (BMI) 35.0-35.9, adult: Secondary | ICD-10-CM | POA: Diagnosis not present

## 2015-05-04 DIAGNOSIS — D509 Iron deficiency anemia, unspecified: Secondary | ICD-10-CM | POA: Diagnosis not present

## 2015-05-04 DIAGNOSIS — D510 Vitamin B12 deficiency anemia due to intrinsic factor deficiency: Secondary | ICD-10-CM | POA: Diagnosis not present

## 2015-05-29 DIAGNOSIS — M5126 Other intervertebral disc displacement, lumbar region: Secondary | ICD-10-CM | POA: Diagnosis not present

## 2015-05-29 DIAGNOSIS — J301 Allergic rhinitis due to pollen: Secondary | ICD-10-CM | POA: Diagnosis not present

## 2015-05-29 DIAGNOSIS — M4316 Spondylolisthesis, lumbar region: Secondary | ICD-10-CM | POA: Diagnosis not present

## 2015-05-29 DIAGNOSIS — J4520 Mild intermittent asthma, uncomplicated: Secondary | ICD-10-CM | POA: Diagnosis not present

## 2015-05-29 DIAGNOSIS — R5383 Other fatigue: Secondary | ICD-10-CM | POA: Diagnosis not present

## 2015-05-29 DIAGNOSIS — G2581 Restless legs syndrome: Secondary | ICD-10-CM | POA: Diagnosis not present

## 2015-05-29 DIAGNOSIS — M5124 Other intervertebral disc displacement, thoracic region: Secondary | ICD-10-CM | POA: Diagnosis not present

## 2015-06-05 DIAGNOSIS — D518 Other vitamin B12 deficiency anemias: Secondary | ICD-10-CM | POA: Diagnosis not present

## 2015-06-07 DIAGNOSIS — J301 Allergic rhinitis due to pollen: Secondary | ICD-10-CM | POA: Diagnosis not present

## 2015-06-14 DIAGNOSIS — J301 Allergic rhinitis due to pollen: Secondary | ICD-10-CM | POA: Diagnosis not present

## 2015-06-19 DIAGNOSIS — Z23 Encounter for immunization: Secondary | ICD-10-CM | POA: Diagnosis not present

## 2015-06-19 DIAGNOSIS — M4807 Spinal stenosis, lumbosacral region: Secondary | ICD-10-CM | POA: Diagnosis not present

## 2015-06-26 DIAGNOSIS — M545 Low back pain: Secondary | ICD-10-CM | POA: Diagnosis not present

## 2015-06-26 DIAGNOSIS — Z6836 Body mass index (BMI) 36.0-36.9, adult: Secondary | ICD-10-CM | POA: Diagnosis not present

## 2015-06-28 DIAGNOSIS — J301 Allergic rhinitis due to pollen: Secondary | ICD-10-CM | POA: Diagnosis not present

## 2015-07-05 DIAGNOSIS — J301 Allergic rhinitis due to pollen: Secondary | ICD-10-CM | POA: Diagnosis not present

## 2015-07-06 DIAGNOSIS — D510 Vitamin B12 deficiency anemia due to intrinsic factor deficiency: Secondary | ICD-10-CM | POA: Diagnosis not present

## 2015-07-12 DIAGNOSIS — N209 Urinary calculus, unspecified: Secondary | ICD-10-CM | POA: Diagnosis not present

## 2015-07-12 DIAGNOSIS — N132 Hydronephrosis with renal and ureteral calculous obstruction: Secondary | ICD-10-CM | POA: Diagnosis not present

## 2015-07-19 DIAGNOSIS — J301 Allergic rhinitis due to pollen: Secondary | ICD-10-CM | POA: Diagnosis not present

## 2015-07-26 DIAGNOSIS — J301 Allergic rhinitis due to pollen: Secondary | ICD-10-CM | POA: Diagnosis not present

## 2015-08-02 DIAGNOSIS — J301 Allergic rhinitis due to pollen: Secondary | ICD-10-CM | POA: Diagnosis not present

## 2015-08-08 DIAGNOSIS — D510 Vitamin B12 deficiency anemia due to intrinsic factor deficiency: Secondary | ICD-10-CM | POA: Diagnosis not present

## 2015-08-09 DIAGNOSIS — J301 Allergic rhinitis due to pollen: Secondary | ICD-10-CM | POA: Diagnosis not present

## 2015-08-16 DIAGNOSIS — J301 Allergic rhinitis due to pollen: Secondary | ICD-10-CM | POA: Diagnosis not present

## 2015-08-17 DIAGNOSIS — Z6835 Body mass index (BMI) 35.0-35.9, adult: Secondary | ICD-10-CM | POA: Diagnosis not present

## 2015-08-17 DIAGNOSIS — J069 Acute upper respiratory infection, unspecified: Secondary | ICD-10-CM | POA: Diagnosis not present

## 2015-08-17 DIAGNOSIS — D509 Iron deficiency anemia, unspecified: Secondary | ICD-10-CM | POA: Diagnosis not present

## 2015-08-17 DIAGNOSIS — E119 Type 2 diabetes mellitus without complications: Secondary | ICD-10-CM | POA: Diagnosis not present

## 2015-08-17 DIAGNOSIS — R5382 Chronic fatigue, unspecified: Secondary | ICD-10-CM | POA: Diagnosis not present

## 2015-08-17 DIAGNOSIS — R51 Headache: Secondary | ICD-10-CM | POA: Diagnosis not present

## 2015-08-17 DIAGNOSIS — E785 Hyperlipidemia, unspecified: Secondary | ICD-10-CM | POA: Diagnosis not present

## 2015-08-17 DIAGNOSIS — D518 Other vitamin B12 deficiency anemias: Secondary | ICD-10-CM | POA: Diagnosis not present

## 2015-08-25 DIAGNOSIS — J301 Allergic rhinitis due to pollen: Secondary | ICD-10-CM | POA: Diagnosis not present

## 2015-08-30 DIAGNOSIS — J301 Allergic rhinitis due to pollen: Secondary | ICD-10-CM | POA: Diagnosis not present

## 2015-09-13 DIAGNOSIS — J301 Allergic rhinitis due to pollen: Secondary | ICD-10-CM | POA: Diagnosis not present

## 2015-09-15 DIAGNOSIS — Z1389 Encounter for screening for other disorder: Secondary | ICD-10-CM | POA: Diagnosis not present

## 2015-09-15 DIAGNOSIS — Z6834 Body mass index (BMI) 34.0-34.9, adult: Secondary | ICD-10-CM | POA: Diagnosis not present

## 2015-09-15 DIAGNOSIS — J208 Acute bronchitis due to other specified organisms: Secondary | ICD-10-CM | POA: Diagnosis not present

## 2015-09-15 DIAGNOSIS — D518 Other vitamin B12 deficiency anemias: Secondary | ICD-10-CM | POA: Diagnosis not present

## 2015-09-15 DIAGNOSIS — E119 Type 2 diabetes mellitus without complications: Secondary | ICD-10-CM | POA: Diagnosis not present

## 2015-09-15 DIAGNOSIS — R51 Headache: Secondary | ICD-10-CM | POA: Diagnosis not present

## 2015-09-15 DIAGNOSIS — J019 Acute sinusitis, unspecified: Secondary | ICD-10-CM | POA: Diagnosis not present

## 2015-09-20 DIAGNOSIS — J301 Allergic rhinitis due to pollen: Secondary | ICD-10-CM | POA: Diagnosis not present

## 2015-10-13 DIAGNOSIS — D510 Vitamin B12 deficiency anemia due to intrinsic factor deficiency: Secondary | ICD-10-CM | POA: Diagnosis not present

## 2015-11-15 DIAGNOSIS — G2581 Restless legs syndrome: Secondary | ICD-10-CM | POA: Insufficient documentation

## 2015-11-15 DIAGNOSIS — K280 Acute gastrojejunal ulcer with hemorrhage: Secondary | ICD-10-CM | POA: Diagnosis present

## 2015-11-15 DIAGNOSIS — K589 Irritable bowel syndrome without diarrhea: Secondary | ICD-10-CM | POA: Diagnosis present

## 2015-11-15 DIAGNOSIS — R110 Nausea: Secondary | ICD-10-CM | POA: Diagnosis not present

## 2015-11-15 DIAGNOSIS — R112 Nausea with vomiting, unspecified: Secondary | ICD-10-CM | POA: Diagnosis not present

## 2015-11-15 DIAGNOSIS — D124 Benign neoplasm of descending colon: Secondary | ICD-10-CM | POA: Diagnosis not present

## 2015-11-15 DIAGNOSIS — K219 Gastro-esophageal reflux disease without esophagitis: Secondary | ICD-10-CM | POA: Insufficient documentation

## 2015-11-15 DIAGNOSIS — R1084 Generalized abdominal pain: Secondary | ICD-10-CM | POA: Diagnosis not present

## 2015-11-15 DIAGNOSIS — G43909 Migraine, unspecified, not intractable, without status migrainosus: Secondary | ICD-10-CM | POA: Diagnosis not present

## 2015-11-15 DIAGNOSIS — R1011 Right upper quadrant pain: Secondary | ICD-10-CM | POA: Diagnosis not present

## 2015-11-15 DIAGNOSIS — K648 Other hemorrhoids: Secondary | ICD-10-CM | POA: Diagnosis not present

## 2015-11-15 DIAGNOSIS — Z79899 Other long term (current) drug therapy: Secondary | ICD-10-CM | POA: Diagnosis not present

## 2015-11-15 DIAGNOSIS — K921 Melena: Secondary | ICD-10-CM | POA: Diagnosis not present

## 2015-11-15 DIAGNOSIS — K289 Gastrojejunal ulcer, unspecified as acute or chronic, without hemorrhage or perforation: Secondary | ICD-10-CM | POA: Diagnosis not present

## 2015-11-15 DIAGNOSIS — F4310 Post-traumatic stress disorder, unspecified: Secondary | ICD-10-CM | POA: Diagnosis not present

## 2015-11-15 DIAGNOSIS — G43709 Chronic migraine without aura, not intractable, without status migrainosus: Secondary | ICD-10-CM | POA: Insufficient documentation

## 2015-11-15 DIAGNOSIS — Z9884 Bariatric surgery status: Secondary | ICD-10-CM | POA: Diagnosis not present

## 2015-11-15 DIAGNOSIS — K7460 Unspecified cirrhosis of liver: Secondary | ICD-10-CM | POA: Diagnosis present

## 2015-11-15 DIAGNOSIS — Z91048 Other nonmedicinal substance allergy status: Secondary | ICD-10-CM | POA: Diagnosis not present

## 2015-11-15 DIAGNOSIS — K621 Rectal polyp: Secondary | ICD-10-CM | POA: Diagnosis not present

## 2015-11-15 DIAGNOSIS — R109 Unspecified abdominal pain: Secondary | ICD-10-CM | POA: Diagnosis not present

## 2015-11-15 DIAGNOSIS — Z62819 Personal history of unspecified abuse in childhood: Secondary | ICD-10-CM | POA: Diagnosis present

## 2015-11-15 DIAGNOSIS — K284 Chronic or unspecified gastrojejunal ulcer with hemorrhage: Secondary | ICD-10-CM | POA: Diagnosis not present

## 2015-11-15 DIAGNOSIS — K920 Hematemesis: Secondary | ICD-10-CM | POA: Diagnosis not present

## 2015-11-15 DIAGNOSIS — K283 Acute gastrojejunal ulcer without hemorrhage or perforation: Secondary | ICD-10-CM | POA: Diagnosis not present

## 2015-11-15 DIAGNOSIS — K2970 Gastritis, unspecified, without bleeding: Secondary | ICD-10-CM | POA: Diagnosis not present

## 2015-11-15 DIAGNOSIS — Z880 Allergy status to penicillin: Secondary | ICD-10-CM | POA: Diagnosis not present

## 2015-11-15 DIAGNOSIS — K5730 Diverticulosis of large intestine without perforation or abscess without bleeding: Secondary | ICD-10-CM | POA: Diagnosis present

## 2015-11-15 DIAGNOSIS — R1012 Left upper quadrant pain: Secondary | ICD-10-CM | POA: Diagnosis not present

## 2015-11-15 DIAGNOSIS — F329 Major depressive disorder, single episode, unspecified: Secondary | ICD-10-CM | POA: Diagnosis not present

## 2015-11-15 DIAGNOSIS — K922 Gastrointestinal hemorrhage, unspecified: Secondary | ICD-10-CM | POA: Diagnosis not present

## 2015-11-15 DIAGNOSIS — K625 Hemorrhage of anus and rectum: Secondary | ICD-10-CM | POA: Diagnosis not present

## 2015-11-15 DIAGNOSIS — D62 Acute posthemorrhagic anemia: Secondary | ICD-10-CM | POA: Diagnosis not present

## 2015-11-15 DIAGNOSIS — D123 Benign neoplasm of transverse colon: Secondary | ICD-10-CM | POA: Diagnosis not present

## 2015-11-15 DIAGNOSIS — R1030 Lower abdominal pain, unspecified: Secondary | ICD-10-CM | POA: Diagnosis not present

## 2015-11-28 DIAGNOSIS — K922 Gastrointestinal hemorrhage, unspecified: Secondary | ICD-10-CM | POA: Diagnosis not present

## 2015-11-28 DIAGNOSIS — D518 Other vitamin B12 deficiency anemias: Secondary | ICD-10-CM | POA: Diagnosis not present

## 2015-11-28 DIAGNOSIS — Z6833 Body mass index (BMI) 33.0-33.9, adult: Secondary | ICD-10-CM | POA: Diagnosis not present

## 2015-12-29 DIAGNOSIS — D518 Other vitamin B12 deficiency anemias: Secondary | ICD-10-CM | POA: Diagnosis not present

## 2015-12-29 DIAGNOSIS — E669 Obesity, unspecified: Secondary | ICD-10-CM | POA: Diagnosis not present

## 2015-12-29 DIAGNOSIS — Z6833 Body mass index (BMI) 33.0-33.9, adult: Secondary | ICD-10-CM | POA: Diagnosis not present

## 2015-12-29 DIAGNOSIS — K589 Irritable bowel syndrome without diarrhea: Secondary | ICD-10-CM | POA: Diagnosis not present

## 2015-12-29 DIAGNOSIS — R51 Headache: Secondary | ICD-10-CM | POA: Diagnosis not present

## 2016-01-08 DIAGNOSIS — D62 Acute posthemorrhagic anemia: Secondary | ICD-10-CM | POA: Diagnosis not present

## 2016-01-08 DIAGNOSIS — Z86010 Personal history of colonic polyps: Secondary | ICD-10-CM | POA: Diagnosis not present

## 2016-01-08 DIAGNOSIS — K289 Gastrojejunal ulcer, unspecified as acute or chronic, without hemorrhage or perforation: Secondary | ICD-10-CM | POA: Diagnosis not present

## 2016-01-25 DIAGNOSIS — Z09 Encounter for follow-up examination after completed treatment for conditions other than malignant neoplasm: Secondary | ICD-10-CM | POA: Diagnosis not present

## 2016-01-25 DIAGNOSIS — K259 Gastric ulcer, unspecified as acute or chronic, without hemorrhage or perforation: Secondary | ICD-10-CM | POA: Diagnosis not present

## 2016-01-25 DIAGNOSIS — Z8711 Personal history of peptic ulcer disease: Secondary | ICD-10-CM | POA: Diagnosis not present

## 2016-01-25 DIAGNOSIS — Z9884 Bariatric surgery status: Secondary | ICD-10-CM | POA: Diagnosis not present

## 2016-01-30 DIAGNOSIS — N390 Urinary tract infection, site not specified: Secondary | ICD-10-CM | POA: Diagnosis not present

## 2016-01-30 DIAGNOSIS — D518 Other vitamin B12 deficiency anemias: Secondary | ICD-10-CM | POA: Diagnosis not present

## 2016-02-14 DIAGNOSIS — R509 Fever, unspecified: Secondary | ICD-10-CM | POA: Diagnosis not present

## 2016-02-14 DIAGNOSIS — Z6834 Body mass index (BMI) 34.0-34.9, adult: Secondary | ICD-10-CM | POA: Diagnosis not present

## 2016-02-14 DIAGNOSIS — R51 Headache: Secondary | ICD-10-CM | POA: Diagnosis not present

## 2016-03-01 DIAGNOSIS — D510 Vitamin B12 deficiency anemia due to intrinsic factor deficiency: Secondary | ICD-10-CM | POA: Diagnosis not present

## 2016-04-03 DIAGNOSIS — K289 Gastrojejunal ulcer, unspecified as acute or chronic, without hemorrhage or perforation: Secondary | ICD-10-CM | POA: Diagnosis not present

## 2016-04-03 DIAGNOSIS — K589 Irritable bowel syndrome without diarrhea: Secondary | ICD-10-CM | POA: Diagnosis not present

## 2016-04-03 DIAGNOSIS — R1013 Epigastric pain: Secondary | ICD-10-CM | POA: Diagnosis not present

## 2016-04-04 DIAGNOSIS — D518 Other vitamin B12 deficiency anemias: Secondary | ICD-10-CM | POA: Diagnosis not present

## 2016-04-09 DIAGNOSIS — M25562 Pain in left knee: Secondary | ICD-10-CM | POA: Diagnosis not present

## 2016-04-09 DIAGNOSIS — M1712 Unilateral primary osteoarthritis, left knee: Secondary | ICD-10-CM | POA: Diagnosis not present

## 2016-04-09 DIAGNOSIS — M25561 Pain in right knee: Secondary | ICD-10-CM | POA: Diagnosis not present

## 2016-04-09 DIAGNOSIS — M7701 Medial epicondylitis, right elbow: Secondary | ICD-10-CM | POA: Diagnosis not present

## 2016-04-25 DIAGNOSIS — M1712 Unilateral primary osteoarthritis, left knee: Secondary | ICD-10-CM | POA: Diagnosis not present

## 2016-05-01 DIAGNOSIS — L509 Urticaria, unspecified: Secondary | ICD-10-CM | POA: Diagnosis not present

## 2016-05-02 DIAGNOSIS — M1712 Unilateral primary osteoarthritis, left knee: Secondary | ICD-10-CM | POA: Diagnosis not present

## 2016-05-09 DIAGNOSIS — M1712 Unilateral primary osteoarthritis, left knee: Secondary | ICD-10-CM | POA: Diagnosis not present

## 2016-05-16 DIAGNOSIS — D518 Other vitamin B12 deficiency anemias: Secondary | ICD-10-CM | POA: Diagnosis not present

## 2016-05-16 DIAGNOSIS — E119 Type 2 diabetes mellitus without complications: Secondary | ICD-10-CM | POA: Diagnosis not present

## 2016-05-16 DIAGNOSIS — H578 Other specified disorders of eye and adnexa: Secondary | ICD-10-CM | POA: Diagnosis not present

## 2016-05-16 DIAGNOSIS — E785 Hyperlipidemia, unspecified: Secondary | ICD-10-CM | POA: Diagnosis not present

## 2016-05-23 DIAGNOSIS — M1712 Unilateral primary osteoarthritis, left knee: Secondary | ICD-10-CM | POA: Diagnosis not present

## 2016-05-30 DIAGNOSIS — M1712 Unilateral primary osteoarthritis, left knee: Secondary | ICD-10-CM | POA: Diagnosis not present

## 2016-06-17 DIAGNOSIS — D518 Other vitamin B12 deficiency anemias: Secondary | ICD-10-CM | POA: Diagnosis not present

## 2016-06-20 ENCOUNTER — Other Ambulatory Visit: Payer: Self-pay | Admitting: Orthopedic Surgery

## 2016-06-20 DIAGNOSIS — M1712 Unilateral primary osteoarthritis, left knee: Secondary | ICD-10-CM | POA: Diagnosis not present

## 2016-06-25 ENCOUNTER — Inpatient Hospital Stay (HOSPITAL_COMMUNITY): Admission: RE | Admit: 2016-06-25 | Payer: Medicaid Other | Source: Ambulatory Visit

## 2016-06-26 ENCOUNTER — Ambulatory Visit (HOSPITAL_COMMUNITY)
Admission: RE | Admit: 2016-06-26 | Discharge: 2016-06-26 | Disposition: A | Discharge status: Home / Self Care | Payer: Medicare Other | Source: Ambulatory Visit | Attending: Orthopedic Surgery | Admitting: Orthopedic Surgery

## 2016-06-26 ENCOUNTER — Encounter (HOSPITAL_COMMUNITY): Payer: Self-pay | Admitting: *Deleted

## 2016-06-26 ENCOUNTER — Encounter (HOSPITAL_COMMUNITY)
Admission: RE | Admit: 2016-06-26 | Discharge: 2016-06-26 | Disposition: A | Discharge status: Home / Self Care | Payer: Medicare Other | Source: Ambulatory Visit | Attending: Orthopedic Surgery | Admitting: Orthopedic Surgery

## 2016-06-26 DIAGNOSIS — M1710 Unilateral primary osteoarthritis, unspecified knee: Secondary | ICD-10-CM | POA: Insufficient documentation

## 2016-06-26 DIAGNOSIS — Z01818 Encounter for other preprocedural examination: Secondary | ICD-10-CM | POA: Insufficient documentation

## 2016-06-26 DIAGNOSIS — R001 Bradycardia, unspecified: Secondary | ICD-10-CM | POA: Diagnosis not present

## 2016-06-26 DIAGNOSIS — Z01812 Encounter for preprocedural laboratory examination: Secondary | ICD-10-CM | POA: Insufficient documentation

## 2016-06-26 DIAGNOSIS — Z01810 Encounter for preprocedural cardiovascular examination: Secondary | ICD-10-CM | POA: Insufficient documentation

## 2016-06-26 DIAGNOSIS — M1711 Unilateral primary osteoarthritis, right knee: Secondary | ICD-10-CM | POA: Insufficient documentation

## 2016-06-26 DIAGNOSIS — R05 Cough: Secondary | ICD-10-CM | POA: Insufficient documentation

## 2016-06-26 DIAGNOSIS — Z9889 Other specified postprocedural states: Secondary | ICD-10-CM | POA: Insufficient documentation

## 2016-06-26 HISTORY — DX: Nontoxic single thyroid nodule: E04.1

## 2016-06-26 HISTORY — DX: Unspecified asthma, uncomplicated: J45.909

## 2016-06-26 HISTORY — DX: Post-traumatic stress disorder, unspecified: F43.10

## 2016-06-26 LAB — URINALYSIS, ROUTINE W REFLEX MICROSCOPIC
Bilirubin Urine: NEGATIVE
GLUCOSE, UA: NEGATIVE mg/dL
HGB URINE DIPSTICK: NEGATIVE
KETONES UR: NEGATIVE mg/dL
Leukocytes, UA: NEGATIVE
Nitrite: NEGATIVE
PROTEIN: NEGATIVE mg/dL
Specific Gravity, Urine: 1.008 (ref 1.005–1.030)
pH: 7.5 (ref 5.0–8.0)

## 2016-06-26 LAB — HCG, SERUM, QUALITATIVE: PREG SERUM: NEGATIVE

## 2016-06-26 LAB — CBC WITH DIFFERENTIAL/PLATELET
BASOS PCT: 0 %
Basophils Absolute: 0 10*3/uL (ref 0.0–0.1)
Eosinophils Absolute: 0.1 10*3/uL (ref 0.0–0.7)
Eosinophils Relative: 1 %
HEMATOCRIT: 42.8 % (ref 36.0–46.0)
Hemoglobin: 13.7 g/dL (ref 12.0–15.0)
LYMPHS PCT: 28 %
Lymphs Abs: 1.9 10*3/uL (ref 0.7–4.0)
MCH: 28.4 pg (ref 26.0–34.0)
MCHC: 32 g/dL (ref 30.0–36.0)
MCV: 88.6 fL (ref 78.0–100.0)
MONO ABS: 0.4 10*3/uL (ref 0.1–1.0)
MONOS PCT: 6 %
NEUTROS ABS: 4.4 10*3/uL (ref 1.7–7.7)
NEUTROS PCT: 65 %
Platelets: 211 10*3/uL (ref 150–400)
RBC: 4.83 MIL/uL (ref 3.87–5.11)
RDW: 15.4 % (ref 11.5–15.5)
WBC: 6.9 10*3/uL (ref 4.0–10.5)

## 2016-06-26 LAB — BASIC METABOLIC PANEL
ANION GAP: 8 (ref 5–15)
BUN: 8 mg/dL (ref 6–20)
CALCIUM: 9.2 mg/dL (ref 8.9–10.3)
CHLORIDE: 111 mmol/L (ref 101–111)
CO2: 21 mmol/L — AB (ref 22–32)
Creatinine, Ser: 0.77 mg/dL (ref 0.44–1.00)
GFR calc non Af Amer: >60 mL/min (ref >60)
GLUCOSE: 96 mg/dL (ref 65–99)
POTASSIUM: 4 mmol/L (ref 3.5–5.1)
Sodium: 140 mmol/L (ref 135–145)

## 2016-06-26 LAB — SURGICAL PCR SCREEN
MRSA, PCR: NEGATIVE
Staphylococcus aureus: NEGATIVE

## 2016-06-26 LAB — ABO/RH: ABO/RH(D): O POS

## 2016-06-26 LAB — PROTIME-INR
INR: 1.01
Prothrombin Time: 13.3 seconds (ref 11.4–15.2)

## 2016-06-26 LAB — TYPE AND SCREEN
ABO/RH(D): O POS
Antibody Screen: NEGATIVE

## 2016-06-26 LAB — APTT: aPTT: 29 seconds (ref 24–36)

## 2016-06-26 NOTE — H&P (Signed)
TOTAL KNEE ADMISSION H&P  Patient is being admitted for left total knee arthroplasty.  Subjective:  Chief Complaint:left knee pain.  HPI: Sally Garrison, 50 y.o. female, has a history of pain and functional disability in the left knee due to arthritis and has failed non-surgical conservative treatments for greater than 12 weeks to includeNSAID's and/or analgesics, corticosteriod injections, viscosupplementation injections, flexibility and strengthening excercises, use of assistive devices, weight reduction as appropriate and activity modification.  Onset of symptoms was gradual, starting 2 years ago with gradually worsening course since that time. The patient noted no past surgery on the left knee(s).  Patient currently rates pain in the left knee(s) at 10 out of 10 with activity. Patient has night pain, worsening of pain with activity and weight bearing, pain that interferes with activities of daily living, pain with passive range of motion and crepitus.  Patient has evidence of joint space narrowing by imaging studies.  There is no active infection.  Patient Active Problem List   Diagnosis Date Noted  . Arthritis of knee 02/28/2014  . Arthritis of knee, right 02/27/2014  . Post-operative state 01/18/2013   Past Medical History:  Diagnosis Date  . Anemia    on monthly period since June  . Anxiety   . Arthritis   . Bronchitis   . Cancer    cervical Cancer  . Chronic kidney disease    kidney stones  . Depression   . Diabetes mellitus without complication    had gastric by-pass not on meds  . GERD (gastroesophageal reflux disease)   . Headache(784.0)    migraines  . PONV (postoperative nausea and vomiting)   . Shortness of breath   . Sleep apnea    not using CPAP    Past Surgical History:  Procedure Laterality Date  . APPENDECTOMY    . bone spur    . BREAST SURGERY Right    lumpectomy x2  . CESAREAN SECTION    . CHOLECYSTECTOMY    . GASTRIC BYPASS  2007  . HERNIA REPAIR      abdominal x3  . KNEE ARTHROSCOPY Right   . OVARIAN CYST SURGERY    . PLANTAR FASCIA RELEASE Bilateral   . SEPTOPLASTY    . SHOULDER OPEN ROTATOR CUFF REPAIR Right   . TONSILLECTOMY     adenoidectomy  . TOTAL KNEE ARTHROPLASTY Right 02/28/2014   Procedure: TOTAL KNEE ARTHROPLASTY;  Surgeon: Kerin Salen, MD;  Location: [facility];  Service: Orthopedics;  Laterality: Right;    No prescriptions prior to admission.   Allergies  Allergen Reactions  . Penicillins Anaphylaxis    Has patient had a PCN reaction causing immediate rash, facial/tongue/throat swelling, SOB or lightheadedness with hypotension:Yes Has patient had a PCN reaction causing severe rash involving mucus membranes or skin necrosis:unsure Has patient had a PCN reaction that required hospitalization:unsure Has patient had a PCN reaction occurring within the last 10 years:No Childhood reaction If all of the above answers are "NO", then may proceed with Cephalosporin use.   . Quinolones Shortness Of Breath  . Other Rash    Steri-strips   . Avelox [Moxifloxacin Hcl In Nacl] Hives  . Ibuprofen Other (See Comments)    bleeding  . Tape     Other reaction(s): Other (See Comments) Blisters especially with steristrip  . Terramycin [Oxytetracycline] Itching    Social History  Substance Use Topics  . Smoking status: Current Every Day Smoker    Packs/day: 0.25    Years:  20.00    Types: Cigarettes  . Smokeless tobacco: Never Used  . Alcohol use No    No family history on file.   Review of Systems  Constitutional: Positive for chills, diaphoresis, fever and malaise/fatigue.  HENT: Positive for congestion.        Sinus problems  Eyes: Negative.   Respiratory: Negative.   Cardiovascular: Negative.   Gastrointestinal: Positive for abdominal pain, diarrhea, heartburn and nausea.  Genitourinary: Positive for dysuria and hematuria.  Musculoskeletal: Positive for joint pain and myalgias.  Neurological: Positive for headaches.   Endo/Heme/Allergies: Bruises/bleeds easily.  Psychiatric/Behavioral: Positive for depression. The patient is nervous/anxious.     Objective:  Physical Exam  Constitutional: She is oriented to person, place, and time. She appears well-developed and well-nourished.  HENT:  Head: Normocephalic and atraumatic.  Eyes: Pupils are equal, round, and reactive to light.  Neck: Normal range of motion. Neck supple.  Respiratory: Effort normal.  Musculoskeletal: She exhibits tenderness.  Patient is tender along the joint lines of her left knee.  Trace effusion, 1+ crepitus as you take her through a range of motion.  Collateral ligaments are stable.  Neurological: She is alert and oriented to person, place, and time.  Skin: Skin is warm and dry.  Psychiatric: She has a normal mood and affect. Her behavior is normal. Judgment and thought content normal.    Vital signs in last 24 hours:    Labs:   Estimated body mass index is 36.47 kg/m as calculated from the following:   Height as of 02/15/14: 5\' 1"  (1.549 m).   Weight as of 02/28/14: 87.5 kg (193 lb).   Imaging Review Plain radiographs demonstrate bilateral AP weightbearing, bilateral Rosenberg, lateral sunrise views of bilateral knees are taken and reviewed in office today.  Patient's right total knee arthroplasty appears be well-placed and well fixed.  Patient's left knee does have moderate to severe arthritis of the medial compartment.   Assessment/Plan:  End stage arthritis, left knee   The patient history, physical examination, clinical judgment of the provider and imaging studies are consistent with end stage degenerative joint disease of the left knee(s) and total knee arthroplasty is deemed medically necessary. The treatment options including medical management, injection therapy arthroscopy and arthroplasty were discussed at length. The risks and benefits of total knee arthroplasty were presented and reviewed. The risks due to  aseptic loosening, infection, stiffness, patella tracking problems, thromboembolic complications and other imponderables were discussed. The patient acknowledged the explanation, agreed to proceed with the plan and consent was signed. Patient is being admitted for inpatient treatment for surgery, pain control, PT, OT, prophylactic antibiotics, VTE prophylaxis, progressive ambulation and ADL's and discharge planning. The patient is planning to be discharged home with home health services

## 2016-06-26 NOTE — Progress Notes (Addendum)
PCP:Dr. Laverna Peace @ [facility] in [place]  Pt. Does not check blood sugars since gastric  bypass surgery back in 2007.   Pt. States she doesn't have sleep apnea since gastric bypass surgery.

## 2016-06-26 NOTE — Pre-Procedure Instructions (Addendum)
    Sally Garrison  06/26/2016      [facility] - [place], [place]  [place] [address] Phone: [phone] Fax: [phone]    Your procedure is scheduled on Mon. Nov. 27  Report to [facility] Admitting at 7:30 A.M.  Call this number if you have problems the morning of surgery:  [phone]   Remember:  Do not eat food or drink liquids after midnight.  Take these medicines the morning of surgery with A SIP OF WATER : tylenol if needed, clonazepam (klonopin), dicyclomine (bentyl), fluroxetine (prozac), flonase if needed, pantoprazole (protonix), imitrex if needed, eye drops, topiramate (topamax)              Stop advil, aleve, motrin, ibuprofen, BC Powders, Goody's, vitamins/herbalmedicines.   Do not wear jewelry, make-up or nail polish.  Do not wear lotions, powders, or perfumes, or deoderant.  Do not shave 48 hours prior to surgery.  Men may shave face and neck.  Do not bring valuables to the hospital.  [facility] is not responsible for any belongings or valuables.  Contacts, dentures or bridgework may not be worn into surgery.  Leave your suitcase in the car.  After surgery it may be brought to your room.  For patients admitted to the hospital, discharge time will be determined by your treatment team.  Patients discharged the day of surgery will not be allowed to drive home.    Special instructions: review preparing for surgery  Please read over the following fact sheets that you were given. Coughing and Deep Breathing and Total Joint Packet

## 2016-06-30 DIAGNOSIS — M1712 Unilateral primary osteoarthritis, left knee: Secondary | ICD-10-CM | POA: Diagnosis present

## 2016-06-30 MED ORDER — TRANEXAMIC ACID 1000 MG/10ML IV SOLN
1000.0000 mg | INTRAVENOUS | Status: AC
  Administered 2016-07-01: 1000 mg via INTRAVENOUS
  Filled 2016-06-30 (×2): qty 10

## 2016-06-30 MED ORDER — SODIUM CHLORIDE 0.9 % IV SOLN
2000.0000 mg | Freq: Once | INTRAVENOUS | Status: DC
  Filled 2016-06-30 (×2): qty 20

## 2016-06-30 MED ORDER — VANCOMYCIN HCL 10 G IV SOLR
1500.0000 mg | INTRAVENOUS | Status: AC
  Administered 2016-07-01: 1500 mg via INTRAVENOUS
  Filled 2016-06-30: qty 1500

## 2016-07-01 ENCOUNTER — Encounter (HOSPITAL_COMMUNITY): Payer: Self-pay | Admitting: *Deleted

## 2016-07-01 ENCOUNTER — Encounter (HOSPITAL_COMMUNITY)
Admission: RE | Disposition: A | Discharge status: Home Health | Payer: Self-pay | Source: Ambulatory Visit | Attending: Orthopedic Surgery

## 2016-07-01 ENCOUNTER — Inpatient Hospital Stay (HOSPITAL_COMMUNITY)
Admission: RE | Admit: 2016-07-01 | Discharge: 2016-07-03 | DRG: 470 | Disposition: A | Discharge status: Home Health | Payer: Medicare Other | Source: Ambulatory Visit | Attending: Orthopedic Surgery | Admitting: Orthopedic Surgery

## 2016-07-01 ENCOUNTER — Inpatient Hospital Stay (HOSPITAL_COMMUNITY): Payer: Medicare Other | Admitting: Anesthesiology

## 2016-07-01 ENCOUNTER — Inpatient Hospital Stay (HOSPITAL_COMMUNITY): Payer: Medicare Other | Admitting: Emergency Medicine

## 2016-07-01 DIAGNOSIS — Z91048 Other nonmedicinal substance allergy status: Secondary | ICD-10-CM

## 2016-07-01 DIAGNOSIS — Z881 Allergy status to other antibiotic agents status: Secondary | ICD-10-CM | POA: Diagnosis not present

## 2016-07-01 DIAGNOSIS — Z8711 Personal history of peptic ulcer disease: Secondary | ICD-10-CM

## 2016-07-01 DIAGNOSIS — F17210 Nicotine dependence, cigarettes, uncomplicated: Secondary | ICD-10-CM | POA: Diagnosis present

## 2016-07-01 DIAGNOSIS — K219 Gastro-esophageal reflux disease without esophagitis: Secondary | ICD-10-CM | POA: Diagnosis present

## 2016-07-01 DIAGNOSIS — Z8541 Personal history of malignant neoplasm of cervix uteri: Secondary | ICD-10-CM

## 2016-07-01 DIAGNOSIS — M25562 Pain in left knee: Secondary | ICD-10-CM | POA: Diagnosis not present

## 2016-07-01 DIAGNOSIS — G2581 Restless legs syndrome: Secondary | ICD-10-CM | POA: Diagnosis present

## 2016-07-01 DIAGNOSIS — Z880 Allergy status to penicillin: Secondary | ICD-10-CM

## 2016-07-01 DIAGNOSIS — Z9884 Bariatric surgery status: Secondary | ICD-10-CM | POA: Diagnosis not present

## 2016-07-01 DIAGNOSIS — F329 Major depressive disorder, single episode, unspecified: Secondary | ICD-10-CM | POA: Diagnosis present

## 2016-07-01 DIAGNOSIS — Z96651 Presence of right artificial knee joint: Secondary | ICD-10-CM | POA: Diagnosis present

## 2016-07-01 DIAGNOSIS — E119 Type 2 diabetes mellitus without complications: Secondary | ICD-10-CM | POA: Diagnosis present

## 2016-07-01 DIAGNOSIS — M1712 Unilateral primary osteoarthritis, left knee: Secondary | ICD-10-CM | POA: Diagnosis not present

## 2016-07-01 DIAGNOSIS — Z888 Allergy status to other drugs, medicaments and biological substances status: Secondary | ICD-10-CM | POA: Diagnosis not present

## 2016-07-01 DIAGNOSIS — F419 Anxiety disorder, unspecified: Secondary | ICD-10-CM | POA: Diagnosis present

## 2016-07-01 DIAGNOSIS — Z886 Allergy status to analgesic agent status: Secondary | ICD-10-CM | POA: Diagnosis not present

## 2016-07-01 DIAGNOSIS — G8918 Other acute postprocedural pain: Secondary | ICD-10-CM | POA: Diagnosis not present

## 2016-07-01 DIAGNOSIS — F418 Other specified anxiety disorders: Secondary | ICD-10-CM | POA: Diagnosis not present

## 2016-07-01 HISTORY — PX: TOTAL KNEE ARTHROPLASTY: SHX125

## 2016-07-01 LAB — GLUCOSE, CAPILLARY: GLUCOSE-CAPILLARY: 83 mg/dL (ref 65–99)

## 2016-07-01 SURGERY — ARTHROPLASTY, KNEE, TOTAL
Anesthesia: Spinal | Laterality: Left

## 2016-07-01 MED ORDER — OXYCODONE HCL 5 MG PO TABS
5.0000 mg | ORAL_TABLET | ORAL | Status: DC | PRN
  Administered 2016-07-01 – 2016-07-03 (×12): 10 mg via ORAL
  Filled 2016-07-01 (×11): qty 2

## 2016-07-01 MED ORDER — PROPOFOL 10 MG/ML IV BOLUS
INTRAVENOUS | Status: AC
  Filled 2016-07-01: qty 20

## 2016-07-01 MED ORDER — OXYCODONE HCL 5 MG PO TABS
ORAL_TABLET | ORAL | Status: AC
  Administered 2016-07-02: 10 mg via ORAL
  Filled 2016-07-01: qty 2

## 2016-07-01 MED ORDER — LEVOCETIRIZINE DIHYDROCHLORIDE 5 MG PO TABS: 5.0000 mg | ORAL_TABLET | Freq: Every day | ORAL | Status: DC

## 2016-07-01 MED ORDER — LIDOCAINE HCL (CARDIAC) 20 MG/ML IV SOLN
INTRAVENOUS | Status: DC | PRN
  Administered 2016-07-01: 50 mg via INTRAVENOUS

## 2016-07-01 MED ORDER — FENTANYL CITRATE (PF) 100 MCG/2ML IJ SOLN
INTRAMUSCULAR | Status: AC
  Filled 2016-07-01: qty 2

## 2016-07-01 MED ORDER — EPINEPHRINE PF 1 MG/ML IJ SOLN
INTRAMUSCULAR | Status: AC
  Filled 2016-07-01: qty 1

## 2016-07-01 MED ORDER — SODIUM CHLORIDE 0.9 % IJ SOLN
INTRAMUSCULAR | Status: DC | PRN
  Administered 2016-07-01: 50 mL via INTRAVENOUS

## 2016-07-01 MED ORDER — PROPOFOL 10 MG/ML IV BOLUS
INTRAVENOUS | Status: DC | PRN
  Administered 2016-07-01 (×3): 20 mg via INTRAVENOUS

## 2016-07-01 MED ORDER — ONDANSETRON HCL 4 MG/2ML IJ SOLN
INTRAMUSCULAR | Status: AC
  Filled 2016-07-01: qty 2

## 2016-07-01 MED ORDER — TOBRAMYCIN-DEXAMETHASONE 0.3-0.1 % OP SUSP
3.0000 [drp] | Freq: Three times a day (TID) | OPHTHALMIC | Status: DC | PRN
  Filled 2016-07-01: qty 2.5

## 2016-07-01 MED ORDER — METHOCARBAMOL 1000 MG/10ML IJ SOLN
500.0000 mg | Freq: Four times a day (QID) | INTRAVENOUS | Status: DC | PRN
  Filled 2016-07-01: qty 5

## 2016-07-01 MED ORDER — FLUOXETINE HCL 20 MG PO CAPS
20.0000 mg | ORAL_CAPSULE | Freq: Every day | ORAL | Status: DC
  Administered 2016-07-02 – 2016-07-03 (×2): 20 mg via ORAL
  Filled 2016-07-01 (×2): qty 1

## 2016-07-01 MED ORDER — SENNOSIDES-DOCUSATE SODIUM 8.6-50 MG PO TABS: 1.0000 | ORAL_TABLET | Freq: Every evening | ORAL | Status: DC | PRN

## 2016-07-01 MED ORDER — ONDANSETRON HCL 4 MG/2ML IJ SOLN
4.0000 mg | Freq: Four times a day (QID) | INTRAMUSCULAR | Status: DC | PRN
  Administered 2016-07-01: 4 mg via INTRAVENOUS
  Filled 2016-07-01: qty 2

## 2016-07-01 MED ORDER — OXYCODONE-ACETAMINOPHEN 5-325 MG PO TABS: 1.0000 | ORAL_TABLET | ORAL | 0 refills | Status: DC | PRN

## 2016-07-01 MED ORDER — ALUM & MAG HYDROXIDE-SIMETH 200-200-20 MG/5ML PO SUSP: 30.0000 mL | ORAL | Status: DC | PRN

## 2016-07-01 MED ORDER — ACETAMINOPHEN 650 MG RE SUPP: 650.0000 mg | Freq: Four times a day (QID) | RECTAL | Status: DC | PRN

## 2016-07-01 MED ORDER — BUPIVACAINE HCL (PF) 0.25 % IJ SOLN
INTRAMUSCULAR | Status: AC
  Filled 2016-07-01: qty 30

## 2016-07-01 MED ORDER — TOPIRAMATE 25 MG PO TABS
75.0000 mg | ORAL_TABLET | Freq: Two times a day (BID) | ORAL | Status: DC
  Administered 2016-07-01 – 2016-07-03 (×4): 75 mg via ORAL
  Filled 2016-07-01 (×4): qty 3

## 2016-07-01 MED ORDER — METOCLOPRAMIDE HCL 5 MG PO TABS: 5.0000 mg | ORAL_TABLET | Freq: Three times a day (TID) | ORAL | Status: DC | PRN

## 2016-07-01 MED ORDER — DOCUSATE SODIUM 100 MG PO CAPS
100.0000 mg | ORAL_CAPSULE | Freq: Two times a day (BID) | ORAL | Status: DC
  Administered 2016-07-01 – 2016-07-03 (×5): 100 mg via ORAL
  Filled 2016-07-01 (×5): qty 1

## 2016-07-01 MED ORDER — TRANEXAMIC ACID 1000 MG/10ML IV SOLN
INTRAVENOUS | Status: DC | PRN
  Administered 2016-07-01: 2000 mg via TOPICAL

## 2016-07-01 MED ORDER — METOCLOPRAMIDE HCL 5 MG/ML IJ SOLN: 5.0000 mg | Freq: Three times a day (TID) | INTRAMUSCULAR | Status: DC | PRN

## 2016-07-01 MED ORDER — MIDAZOLAM HCL 2 MG/2ML IJ SOLN
INTRAMUSCULAR | Status: AC
  Administered 2016-07-01: 2 mg via INTRAVENOUS
  Filled 2016-07-01: qty 2

## 2016-07-01 MED ORDER — GABAPENTIN 300 MG PO CAPS
300.0000 mg | ORAL_CAPSULE | Freq: Three times a day (TID) | ORAL | Status: DC
  Administered 2016-07-02 – 2016-07-03 (×4): 300 mg via ORAL
  Filled 2016-07-01 (×6): qty 1

## 2016-07-01 MED ORDER — DICYCLOMINE HCL 10 MG PO CAPS: 10.0000 mg | ORAL_CAPSULE | Freq: Four times a day (QID) | ORAL | Status: DC | PRN

## 2016-07-01 MED ORDER — MENTHOL 3 MG MT LOZG: 1.0000 | LOZENGE | OROMUCOSAL | Status: DC | PRN

## 2016-07-01 MED ORDER — EPINEPHRINE PF 1 MG/ML IJ SOLN
INTRAMUSCULAR | Status: DC | PRN
Start: 2016-07-01 — End: 2016-07-01
  Administered 2016-07-01: .15 mg

## 2016-07-01 MED ORDER — PROPOFOL 10 MG/ML IV BOLUS
INTRAVENOUS | Status: AC
  Filled 2016-07-01: qty 40

## 2016-07-01 MED ORDER — SODIUM CHLORIDE 0.9 % IR SOLN
Status: DC | PRN
  Administered 2016-07-01: 3000 mL

## 2016-07-01 MED ORDER — METHOCARBAMOL 500 MG PO TABS: 500.0000 mg | ORAL_TABLET | Freq: Two times a day (BID) | ORAL | 0 refills | Status: AC

## 2016-07-01 MED ORDER — BUPIVACAINE LIPOSOME 1.3 % IJ SUSP
20.0000 mL | Freq: Once | INTRAMUSCULAR | Status: DC
  Filled 2016-07-01: qty 20

## 2016-07-01 MED ORDER — VANCOMYCIN HCL 1000 MG IV SOLR
INTRAVENOUS | Status: AC
  Filled 2016-07-01: qty 1000

## 2016-07-01 MED ORDER — CHLORHEXIDINE GLUCONATE 4 % EX LIQD: 60.0000 mL | Freq: Once | CUTANEOUS | Status: DC

## 2016-07-01 MED ORDER — SCOPOLAMINE 1 MG/3DAYS TD PT72
MEDICATED_PATCH | TRANSDERMAL | Status: AC
  Administered 2016-07-01: 1.5 mg via TRANSDERMAL
  Filled 2016-07-01: qty 1

## 2016-07-01 MED ORDER — BISACODYL 5 MG PO TBEC: 5.0000 mg | DELAYED_RELEASE_TABLET | Freq: Every day | ORAL | Status: DC | PRN

## 2016-07-01 MED ORDER — MIDAZOLAM HCL 2 MG/2ML IJ SOLN
2.0000 mg | Freq: Once | INTRAMUSCULAR | Status: AC
  Administered 2016-07-01: 2 mg via INTRAVENOUS

## 2016-07-01 MED ORDER — HYDROMORPHONE HCL 1 MG/ML IJ SOLN: 1.0000 mg | INTRAMUSCULAR | Status: DC | PRN

## 2016-07-01 MED ORDER — TRANEXAMIC ACID 1000 MG/10ML IV SOLN
1000.0000 mg | Freq: Once | INTRAVENOUS | Status: DC
  Filled 2016-07-01: qty 10

## 2016-07-01 MED ORDER — ACETAMINOPHEN 325 MG PO TABS: 650.0000 mg | ORAL_TABLET | Freq: Four times a day (QID) | ORAL | Status: DC | PRN

## 2016-07-01 MED ORDER — BUPIVACAINE LIPOSOME 1.3 % IJ SUSP
INTRAMUSCULAR | Status: DC | PRN
  Administered 2016-07-01: 20 mL

## 2016-07-01 MED ORDER — METHOCARBAMOL 500 MG PO TABS
500.0000 mg | ORAL_TABLET | Freq: Four times a day (QID) | ORAL | Status: DC | PRN
  Administered 2016-07-01 – 2016-07-03 (×6): 500 mg via ORAL
  Filled 2016-07-01 (×6): qty 1

## 2016-07-01 MED ORDER — SCOPOLAMINE 1 MG/3DAYS TD PT72
1.0000 | MEDICATED_PATCH | TRANSDERMAL | Status: DC
  Administered 2016-07-01: 1.5 mg via TRANSDERMAL

## 2016-07-01 MED ORDER — FLEET ENEMA 7-19 GM/118ML RE ENEM: 1.0000 | ENEMA | Freq: Once | RECTAL | Status: DC | PRN

## 2016-07-01 MED ORDER — MEPERIDINE HCL 25 MG/ML IJ SOLN: 6.2500 mg | INTRAMUSCULAR | Status: DC | PRN

## 2016-07-01 MED ORDER — APIXABAN 2.5 MG PO TABS: 2.5000 mg | ORAL_TABLET | Freq: Two times a day (BID) | ORAL | 0 refills | Status: DC

## 2016-07-01 MED ORDER — HYDROMORPHONE HCL 2 MG/ML IJ SOLN
1.0000 mg | INTRAMUSCULAR | Status: DC | PRN
  Administered 2016-07-01 – 2016-07-02 (×6): 1 mg via INTRAVENOUS
  Filled 2016-07-01 (×6): qty 1

## 2016-07-01 MED ORDER — APIXABAN 2.5 MG PO TABS
2.5000 mg | ORAL_TABLET | Freq: Two times a day (BID) | ORAL | Status: DC
  Administered 2016-07-02 – 2016-07-03 (×3): 2.5 mg via ORAL
  Filled 2016-07-01 (×3): qty 1

## 2016-07-01 MED ORDER — CLONAZEPAM 0.5 MG PO TABS
0.2500 mg | ORAL_TABLET | Freq: Three times a day (TID) | ORAL | Status: DC
  Administered 2016-07-01 – 2016-07-03 (×6): 0.25 mg via ORAL
  Filled 2016-07-01 (×6): qty 1

## 2016-07-01 MED ORDER — PROPOFOL 500 MG/50ML IV EMUL
INTRAVENOUS | Status: DC | PRN
  Administered 2016-07-01: 50 ug/kg/min via INTRAVENOUS

## 2016-07-01 MED ORDER — PHENOL 1.4 % MT LIQD: 1.0000 | OROMUCOSAL | Status: DC | PRN

## 2016-07-01 MED ORDER — DIPHENHYDRAMINE HCL 12.5 MG/5ML PO ELIX: 12.5000 mg | ORAL_SOLUTION | ORAL | Status: DC | PRN

## 2016-07-01 MED ORDER — BUPIVACAINE HCL (PF) 0.25 % IJ SOLN
INTRAMUSCULAR | Status: DC | PRN
  Administered 2016-07-01: 30 mL

## 2016-07-01 MED ORDER — HYDROMORPHONE HCL 1 MG/ML IJ SOLN
0.2500 mg | INTRAMUSCULAR | Status: DC | PRN
  Administered 2016-07-01: 1 mg via INTRAVENOUS

## 2016-07-01 MED ORDER — SUMATRIPTAN SUCCINATE 50 MG PO TABS
50.0000 mg | ORAL_TABLET | ORAL | Status: DC | PRN
  Administered 2016-07-01: 50 mg via ORAL
  Filled 2016-07-01: qty 1
  Filled 2016-07-01: qty 2
  Filled 2016-07-01: qty 1
  Filled 2016-07-01: qty 2
  Filled 2016-07-01 (×2): qty 1

## 2016-07-01 MED ORDER — PANTOPRAZOLE SODIUM 40 MG PO TBEC
40.0000 mg | DELAYED_RELEASE_TABLET | Freq: Two times a day (BID) | ORAL | Status: DC
  Administered 2016-07-02 – 2016-07-03 (×4): 40 mg via ORAL
  Filled 2016-07-01 (×4): qty 1

## 2016-07-01 MED ORDER — LACTATED RINGERS IV SOLN
INTRAVENOUS | Status: DC
  Administered 2016-07-01 (×2): via INTRAVENOUS

## 2016-07-01 MED ORDER — ONDANSETRON HCL 4 MG/2ML IJ SOLN: 4.0000 mg | Freq: Once | INTRAMUSCULAR | Status: DC | PRN

## 2016-07-01 MED ORDER — DEXTROSE-NACL 5-0.45 % IV SOLN: INTRAVENOUS | Status: DC

## 2016-07-01 MED ORDER — MIDAZOLAM HCL 2 MG/2ML IJ SOLN
INTRAMUSCULAR | Status: AC
  Filled 2016-07-01: qty 2

## 2016-07-01 MED ORDER — ONDANSETRON HCL 4 MG PO TABS
4.0000 mg | ORAL_TABLET | Freq: Four times a day (QID) | ORAL | Status: DC | PRN
  Administered 2016-07-02: 4 mg via ORAL
  Filled 2016-07-01: qty 1

## 2016-07-01 MED ORDER — FENTANYL CITRATE (PF) 100 MCG/2ML IJ SOLN
INTRAMUSCULAR | Status: AC
  Administered 2016-07-01: 100 ug via INTRAVENOUS
  Filled 2016-07-01: qty 2

## 2016-07-01 MED ORDER — LORATADINE 10 MG PO TABS
10.0000 mg | ORAL_TABLET | Freq: Every day | ORAL | Status: DC
  Administered 2016-07-01 – 2016-07-03 (×3): 10 mg via ORAL
  Filled 2016-07-01 (×3): qty 1

## 2016-07-01 MED ORDER — ROPINIROLE HCL 1 MG PO TABS
1.0000 mg | ORAL_TABLET | Freq: Every day | ORAL | Status: DC
  Administered 2016-07-01 – 2016-07-02 (×2): 1 mg via ORAL
  Filled 2016-07-01 (×2): qty 1

## 2016-07-01 MED ORDER — FENTANYL CITRATE (PF) 100 MCG/2ML IJ SOLN
100.0000 ug | Freq: Once | INTRAMUSCULAR | Status: AC
  Administered 2016-07-01: 100 ug via INTRAVENOUS

## 2016-07-01 MED ORDER — VANCOMYCIN HCL 1000 MG IV SOLR
INTRAVENOUS | Status: DC | PRN
  Administered 2016-07-01: 1000 mg

## 2016-07-01 MED ORDER — KCL IN DEXTROSE-NACL 20-5-0.45 MEQ/L-%-% IV SOLN
INTRAVENOUS | Status: DC
  Administered 2016-07-01 – 2016-07-02 (×2): via INTRAVENOUS
  Filled 2016-07-01 (×2): qty 1000

## 2016-07-01 MED ORDER — HYDROMORPHONE HCL 1 MG/ML IJ SOLN
INTRAMUSCULAR | Status: AC
  Filled 2016-07-01: qty 1

## 2016-07-01 MED ORDER — LIDOCAINE 2% (20 MG/ML) 5 ML SYRINGE
INTRAMUSCULAR | Status: AC
  Filled 2016-07-01: qty 5

## 2016-07-01 MED ORDER — FLUTICASONE PROPIONATE 50 MCG/ACT NA SUSP
1.0000 | Freq: Every day | NASAL | Status: DC | PRN
  Filled 2016-07-01: qty 16

## 2016-07-01 SURGICAL SUPPLY — 57 items
BANDAGE ESMARK 6X9 LF (GAUZE/BANDAGES/DRESSINGS) ×1 IMPLANT
BLADE SAG 18X100X1.27 (BLADE) ×3 IMPLANT
BLADE SAW SGTL 13X75X1.27 (BLADE) ×3 IMPLANT
BLADE SURG ROTATE 9660 (MISCELLANEOUS) IMPLANT
BNDG CMPR 9X6 STRL LF SNTH (GAUZE/BANDAGES/DRESSINGS) ×1
BNDG CMPR MED 10X6 ELC LF (GAUZE/BANDAGES/DRESSINGS) ×1
BNDG ELASTIC 6X10 VLCR STRL LF (GAUZE/BANDAGES/DRESSINGS) ×3 IMPLANT
BNDG ESMARK 6X9 LF (GAUZE/BANDAGES/DRESSINGS) ×3
BOWL SMART MIX CTS (DISPOSABLE) ×3 IMPLANT
CAPT KNEE TOTAL 3 ATTUNE ×2 IMPLANT
CEMENT HV SMART SET (Cement) ×6 IMPLANT
COVER SURGICAL LIGHT HANDLE (MISCELLANEOUS) ×3 IMPLANT
CUFF TOURNIQUET SINGLE 34IN LL (TOURNIQUET CUFF) ×3 IMPLANT
CUFF TOURNIQUET SINGLE 44IN (TOURNIQUET CUFF) IMPLANT
DRAPE EXTREMITY TIBURON (DRAPES) ×2 IMPLANT
DRAPE HALF SHEET 40X57 (DRAPES) ×3 IMPLANT
DRAPE U-SHAPE 47X51 STRL (DRAPES) ×3 IMPLANT
DRESSING AQUACEL AG SP 3.5X10 (GAUZE/BANDAGES/DRESSINGS) IMPLANT
DRSG AQUACEL AG SP 3.5X10 (GAUZE/BANDAGES/DRESSINGS) ×3
DURAPREP 26ML APPLICATOR (WOUND CARE) ×6 IMPLANT
ELECT REM PT RETURN 9FT ADLT (ELECTROSURGICAL) ×3
ELECTRODE REM PT RTRN 9FT ADLT (ELECTROSURGICAL) ×1 IMPLANT
EVACUATOR 1/8 PVC DRAIN (DRAIN) IMPLANT
FLUID NSS /IRRIG 3000 ML XXX (IV SOLUTION) ×2 IMPLANT
GLOVE BIO SURGEON STRL SZ7.5 (GLOVE) ×3 IMPLANT
GLOVE BIO SURGEON STRL SZ8.5 (GLOVE) ×3 IMPLANT
GLOVE BIOGEL PI IND STRL 8 (GLOVE) ×1 IMPLANT
GLOVE BIOGEL PI IND STRL 9 (GLOVE) ×1 IMPLANT
GLOVE BIOGEL PI INDICATOR 8 (GLOVE) ×2
GLOVE BIOGEL PI INDICATOR 9 (GLOVE) ×2
GOWN STRL REUS W/ TWL LRG LVL3 (GOWN DISPOSABLE) ×1 IMPLANT
GOWN STRL REUS W/ TWL XL LVL3 (GOWN DISPOSABLE) ×2 IMPLANT
GOWN STRL REUS W/TWL LRG LVL3 (GOWN DISPOSABLE)
GOWN STRL REUS W/TWL XL LVL3 (GOWN DISPOSABLE) ×9
HANDPIECE INTERPULSE COAX TIP (DISPOSABLE) ×6
HOOD PEEL AWAY FACE SHEILD DIS (HOOD) ×6 IMPLANT
KIT BASIN OR (CUSTOM PROCEDURE TRAY) ×3 IMPLANT
KIT ROOM TURNOVER OR (KITS) ×3 IMPLANT
MANIFOLD NEPTUNE II (INSTRUMENTS) ×3 IMPLANT
NEEDLE 22X1 1/2 (OR ONLY) (NEEDLE) ×6 IMPLANT
NS IRRIG 1000ML POUR BTL (IV SOLUTION) ×3 IMPLANT
PACK TOTAL JOINT (CUSTOM PROCEDURE TRAY) ×3 IMPLANT
PAD ARMBOARD 7.5X6 YLW CONV (MISCELLANEOUS) ×6 IMPLANT
SET HNDPC FAN SPRY TIP SCT (DISPOSABLE) ×1 IMPLANT
SUT VIC AB 0 CT1 27 (SUTURE) ×3
SUT VIC AB 0 CT1 27XBRD ANBCTR (SUTURE) ×1 IMPLANT
SUT VIC AB 1 CTX 36 (SUTURE) ×3
SUT VIC AB 1 CTX36XBRD ANBCTR (SUTURE) ×1 IMPLANT
SUT VIC AB 2-0 CT1 27 (SUTURE)
SUT VIC AB 2-0 CT1 TAPERPNT 27 (SUTURE) IMPLANT
SUT VIC AB 3-0 CT1 27 (SUTURE) ×3
SUT VIC AB 3-0 CT1 TAPERPNT 27 (SUTURE) ×1 IMPLANT
SYR CONTROL 10ML LL (SYRINGE) ×6 IMPLANT
TOWEL OR 17X24 6PK STRL BLUE (TOWEL DISPOSABLE) ×3 IMPLANT
TOWEL OR 17X26 10 PK STRL BLUE (TOWEL DISPOSABLE) ×3 IMPLANT
TRAY CATH 16FR W/PLASTIC CATH (SET/KITS/TRAYS/PACK) IMPLANT
WATER STERILE IRR 1000ML POUR (IV SOLUTION) ×3 IMPLANT

## 2016-07-01 NOTE — Anesthesia Postprocedure Evaluation (Signed)
Anesthesia Post Note  Patient: Sally Garrison  Procedure(s) Performed: Procedure(s) (LRB): TOTAL KNEE ARTHROPLASTY (Left)  Patient location during evaluation: PACU Anesthesia Type: Spinal Level of consciousness: oriented and awake and alert Pain management: pain level controlled Vital Signs Assessment: post-procedure vital signs reviewed and stable Respiratory status: spontaneous breathing, respiratory function stable and patient connected to nasal cannula oxygen Cardiovascular status: blood pressure returned to baseline and stable Postop Assessment: no headache and no backache Anesthetic complications: no    Last Vitals:  Vitals:   07/01/16 1257 07/01/16 1300  BP:    Pulse: (!) 57 (!) 55  Resp: 12 (!) 7  Temp:      Last Pain:  Vitals:   07/01/16 1257  TempSrc:   PainSc: 10-Worst pain ever                 Slyvester Latona DAVID

## 2016-07-01 NOTE — Discharge Instructions (Addendum)
INSTRUCTIONS AFTER JOINT REPLACEMENT  ° °o Remove items at home which could result in a fall. This includes throw rugs or furniture in walking pathways °o ICE to the affected joint every three hours while awake for 30 minutes at a time, for at least the first 3-5 days, and then as needed for pain and swelling.  Continue to use ice for pain and swelling. You may notice swelling that will progress down to the foot and ankle.  This is normal after surgery.  Elevate your leg when you are not up walking on it.   °o Continue to use the breathing machine you got in the hospital (incentive spirometer) which will help keep your temperature down.  It is common for your temperature to cycle up and down following surgery, especially at night when you are not up moving around and exerting yourself.  The breathing machine keeps your lungs expanded and your temperature down. ° ° °DIET:  As you were doing prior to hospitalization, we recommend a well-balanced diet. ° °DRESSING / WOUND CARE / SHOWERING ° °Keep the surgical dressing until follow up.  The dressing is water proof, so you can shower without any extra covering.  IF THE DRESSING FALLS OFF or the wound gets wet inside, change the dressing with sterile gauze.  Please use good hand washing techniques before changing the dressing.  Do not use any lotions or creams on the incision until instructed by your surgeon.   ° °ACTIVITY ° °o Increase activity slowly as tolerated, but follow the weight bearing instructions below.   °o No driving for 6 weeks or until further direction given by your physician.  You cannot drive while taking narcotics.  °o No lifting or carrying greater than 10 lbs. until further directed by your surgeon. °o Avoid periods of inactivity such as sitting longer than an hour when not asleep. This helps prevent blood clots.  °o You may return to work once you are authorized by your doctor.  ° ° ° °WEIGHT BEARING  ° °Weight bearing as tolerated with assist  device (walker, cane, etc) as directed, use it as long as suggested by your surgeon or therapist, typically at least 4-6 weeks. ° ° °EXERCISES ° °Results after joint replacement surgery are often greatly improved when you follow the exercise, range of motion and muscle strengthening exercises prescribed by your doctor. Safety measures are also important to protect the joint from further injury. Any time any of these exercises cause you to have increased pain or swelling, decrease what you are doing until you are comfortable again and then slowly increase them. If you have problems or questions, call your caregiver or physical therapist for advice.  ° °Rehabilitation is important following a joint replacement. After just a few days of immobilization, the muscles of the leg can become weakened and shrink (atrophy).  These exercises are designed to build up the tone and strength of the thigh and leg muscles and to improve motion. Often times heat used for twenty to thirty minutes before working out will loosen up your tissues and help with improving the range of motion but do not use heat for the first two weeks following surgery (sometimes heat can increase post-operative swelling).  ° °These exercises can be done on a training (exercise) mat, on the floor, on a table or on a bed. Use whatever works the best and is most comfortable for you.    Use music or television while you are exercising so that   the exercises are a pleasant break in your day. This will make your life better with the exercises acting as a break in your routine that you can look forward to.   Perform all exercises about fifteen times, three times per day or as directed.  You should exercise both the operative leg and the other leg as well. ° °Exercises include: °  °• Quad Sets - Tighten up the muscle on the front of the thigh (Quad) and hold for 5-10 seconds.   °• Straight Leg Raises - With your knee straight (if you were given a brace, keep it on),  lift the leg to 60 degrees, hold for 3 seconds, and slowly lower the leg.  Perform this exercise against resistance later as your leg gets stronger.  °• Leg Slides: Lying on your back, slowly slide your foot toward your buttocks, bending your knee up off the floor (only go as far as is comfortable). Then slowly slide your foot back down until your leg is flat on the floor again.  °• Angel Wings: Lying on your back spread your legs to the side as far apart as you can without causing discomfort.  °• Hamstring Strength:  Lying on your back, push your heel against the floor with your leg straight by tightening up the muscles of your buttocks.  Repeat, but this time bend your knee to a comfortable angle, and push your heel against the floor.  You may put a pillow under the heel to make it more comfortable if necessary.  ° °A rehabilitation program following joint replacement surgery can speed recovery and prevent re-injury in the future due to weakened muscles. Contact your doctor or a physical therapist for more information on knee rehabilitation.  ° ° °CONSTIPATION ° °Constipation is defined medically as fewer than three stools per week and severe constipation as less than one stool per week.  Even if you have a regular bowel pattern at home, your normal regimen is likely to be disrupted due to multiple reasons following surgery.  Combination of anesthesia, postoperative narcotics, change in appetite and fluid intake all can affect your bowels.  ° °YOU MUST use at least one of the following options; they are listed in order of increasing strength to get the job done.  They are all available over the counter, and you may need to use some, POSSIBLY even all of these options:   ° °Drink plenty of fluids (prune juice may be helpful) and high fiber foods °Colace 100 mg by mouth twice a day  °Senokot for constipation as directed and as needed Dulcolax (bisacodyl), take with full glass of water  °Miralax (polyethylene glycol)  once or twice a day as needed. ° °If you have tried all these things and are unable to have a bowel movement in the first 3-4 days after surgery call either your surgeon or your primary doctor.   ° °If you experience loose stools or diarrhea, hold the medications until you stool forms back up.  If your symptoms do not get better within 1 week or if they get worse, check with your doctor.  If you experience "the worst abdominal pain ever" or develop nausea or vomiting, please contact the office immediately for further recommendations for treatment. ° ° °ITCHING:  If you experience itching with your medications, try taking only a single pain pill, or even half a pain pill at a time.  You can also use Benadryl over the counter for itching or also to   help with sleep.  ° °TED HOSE STOCKINGS:  Use stockings on both legs until for at least 2 weeks or as directed by physician office. They may be removed at night for sleeping. ° °MEDICATIONS:  See your medication summary on the “After Visit Summary” that nursing will review with you.  You may have some home medications which will be placed on hold until you complete the course of blood thinner medication.  It is important for you to complete the blood thinner medication as prescribed. ° °PRECAUTIONS:  If you experience chest pain or shortness of breath - call 911 immediately for transfer to the hospital emergency department.  ° °If you develop a fever greater that 101 F, purulent drainage from wound, increased redness or drainage from wound, foul odor from the wound/dressing, or calf pain - CONTACT YOUR SURGEON.   °                                                °FOLLOW-UP APPOINTMENTS:  If you do not already have a post-op appointment, please call the office for an appointment to be seen by your surgeon.  Guidelines for how soon to be seen are listed in your “After Visit Summary”, but are typically between 1-4 weeks after surgery. ° °OTHER INSTRUCTIONS:  ° °Knee  Replacement:  Do not place pillow under knee, focus on keeping the knee straight while resting. CPM instructions: 0-90 degrees, 2 hours in the morning, 2 hours in the afternoon, and 2 hours in the evening. Place foam block, curve side up under heel at all times except when in CPM or when walking.  DO NOT modify, tear, cut, or change the foam block in any way. ° °MAKE SURE YOU:  °• Understand these instructions.  °• Get help right away if you are not doing well or get worse.  ° ° °Thank you for letting us be a part of your medical care team.  It is a privilege we respect greatly.  We hope these instructions will help you stay on track for a fast and full recovery!  ° ° ° ° °Information on my medicine - ELIQUIS® (apixaban) ° °This medication education was reviewed with me or my healthcare representative as part of my discharge preparation.  The pharmacist that spoke with me during my hospital stay was:  Trinidee Schrag Dien, RPH ° °Why was Eliquis® prescribed for you? °Eliquis® was prescribed for you to reduce the risk of blood clots forming after orthopedic surgery.   ° °What do You need to know about Eliquis®? °Take your Eliquis® TWICE DAILY - one tablet in the morning and one tablet in the evening with or without food.  It would be best to take the dose about the same time each day. ° °If you have difficulty swallowing the tablet whole please discuss with your pharmacist how to take the medication safely. ° °Take Eliquis® exactly as prescribed by your doctor and DO NOT stop taking Eliquis® without talking to the doctor who prescribed the medication.  Stopping without other medication to take the place of Eliquis® may increase your risk of developing a clot. ° °After discharge, you should have regular check-up appointments with your healthcare provider that is prescribing your Eliquis®. ° °What do you do if you miss a dose? °If a dose of ELIQUIS® is not taken at the   scheduled time, take it as soon as possible on the same  day and twice-daily administration should be resumed.  The dose should not be doubled to make up for a missed dose.  Do not take more than one tablet of ELIQUIS at the same time. ° °Important Safety Information °A possible side effect of Eliquis® is bleeding. You should call your healthcare provider right away if you experience any of the following: °? Bleeding from an injury or your nose that does not stop. °? Unusual colored urine (red or dark brown) or unusual colored stools (red or black). °? Unusual bruising for unknown reasons. °? A serious fall or if you hit your head (even if there is no bleeding). ° °Some medicines may interact with Eliquis® and might increase your risk of bleeding or clotting while on Eliquis®. To help avoid this, consult your healthcare provider or pharmacist prior to using any new prescription or non-prescription medications, including herbals, vitamins, non-steroidal anti-inflammatory drugs (NSAIDs) and supplements. ° °This website has more information on Eliquis® (apixaban): http://www.eliquis.com/eliquis/home ° °

## 2016-07-01 NOTE — Anesthesia Procedure Notes (Addendum)
Anesthesia Regional Block:  Adductor canal block  Pre-Anesthetic Checklist: ,, timeout performed, Correct Patient, Correct Site, Correct Laterality, Correct Procedure, Correct Position, site marked, Risks and benefits discussed,  Surgical consent,  Pre-op evaluation,  At surgeon's request and post-op pain management  Laterality: Left  Prep: chloraprep       Needles:  Injection technique: Single-shot  Needle Type: Echogenic Stimulator Needle     Needle Length: 9cm 9 cm Needle Gauge: 21 and 21 G    Additional Needles:  Procedures: ultrasound guided (picture in chart) Adductor canal block Narrative:  Start time: 07/01/2016 9:15 AM End time: 07/01/2016 9:30 AM Injection made incrementally with aspirations every 5 mL.  Performed by: Personally  Anesthesiologist: Lillia Abed  Additional Notes: Monitors applied. Patient sedated. Sterile prep and drape,hand hygiene and sterile gloves were used. Relevant anatomy identified.Needle position confirmed.Local anesthetic injected incrementally after negative aspiration. Local anesthetic spread visualized around nerve(s). Vascular puncture avoided. No complications. Image printed for medical record.The patient tolerated the procedure well.    Lillia Abed MD

## 2016-07-01 NOTE — Anesthesia Preprocedure Evaluation (Signed)
Anesthesia Evaluation  Patient identified by MRN, date of birth, ID band Patient awake    Reviewed: Allergy & Precautions, NPO status , Patient's Chart, lab work & pertinent test results  History of Anesthesia Complications (+) PONV  Airway Mallampati: I  TM Distance: >3 FB Neck ROM: Full    Dental   Pulmonary asthma , Current Smoker,    Pulmonary exam normal        Cardiovascular Normal cardiovascular exam     Neuro/Psych Anxiety Depression    GI/Hepatic GERD  Medicated and Controlled,  Endo/Other  diabetes  Renal/GU      Musculoskeletal   Abdominal   Peds  Hematology   Anesthesia Other Findings   Reproductive/Obstetrics                             Anesthesia Physical Anesthesia Plan  ASA: II  Anesthesia Plan: Spinal   Post-op Pain Management:  Regional for Post-op pain   Induction: Intravenous  Airway Management Planned: Simple Face Mask  Additional Equipment:   Intra-op Plan:   Post-operative Plan:   Informed Consent: I have reviewed the patients History and Physical, chart, labs and discussed the procedure including the risks, benefits and alternatives for the proposed anesthesia with the patient or authorized representative who has indicated his/her understanding and acceptance.     Plan Discussed with: CRNA and Surgeon  Anesthesia Plan Comments:         Anesthesia Quick Evaluation

## 2016-07-01 NOTE — Progress Notes (Signed)
Pink tape used to secure IV. I initially informed patient that I was going to change it to paper tape. Patient stated that I did not need to change the tape as it was only the steri- strip tape that causes the problem.

## 2016-07-01 NOTE — Interval H&P Note (Signed)
History and Physical Interval Note:  07/01/2016 9:33 AM  Sally Garrison  has presented today for surgery, with the diagnosis of LEFT KNEE OSTEOARTHRITIS  The various methods of treatment have been discussed with the patient and family. After consideration of risks, benefits and other options for treatment, the patient has consented to  Procedure(s): TOTAL KNEE ARTHROPLASTY (Left) as a surgical intervention .  The patient's history has been reviewed, patient examined, no change in status, stable for surgery.  I have reviewed the patient's chart and labs.  Questions were answered to the patient's satisfaction.     Kerin Salen

## 2016-07-01 NOTE — Transfer of Care (Signed)
Immediate Anesthesia Transfer of Care Note  Patient: Sally Garrison  Procedure(s) Performed: Procedure(s): TOTAL KNEE ARTHROPLASTY (Left)  Patient Location: PACU  Anesthesia Type:Spinal  Level of Consciousness: awake, alert , oriented and patient cooperative  Airway & Oxygen Therapy: Patient Spontanous Breathing and Patient connected to nasal cannula oxygen  Post-op Assessment: Report given to RN and Post -op Vital signs reviewed and stable  Post vital signs: Reviewed and stable  Last Vitals:  Vitals:   07/01/16 0943 07/01/16 0944  BP: (!) 111/52   Pulse: 78 78  Resp: 15 13  Temp:      Last Pain:  Vitals:   07/01/16 0943  TempSrc:   PainSc: 6          Complications: No apparent anesthesia complications

## 2016-07-01 NOTE — Evaluation (Signed)
Physical Therapy Evaluation Patient Details Name: Sally Garrison MRN: [id number] DOB: 26-Apr-1966 Today's Date: 07/01/2016   History of Present Illness  Admitted for LTKA, WBAT;  has a pertinent past medical history of Anemia; Anxiety; Arthritis; Asthma; Cancer ([place]); Chronic kidney disease; Depression; Diabetes mellitus without complication (HCC);  PONV (postoperative nausea and vomiting); PTSD (post-traumatic stress disorder);  has a pertinent past surgical history that includes  Shoulder open rotator cuff repair (Right); and Total knee arthroplasty (Right, 02/28/2014).  Clinical Impression   Pt is s/p TKA resulting in the deficits listed below (see PT Problem List). Sleepy, but arousable; Will need to work on L knee stance stability;  Pt will benefit from skilled PT to increase their independence and safety with mobility to allow discharge to the venue listed below.      Follow Up Recommendations Home health PT;Supervision/Assistance - 24 hour    Equipment Recommendations  None recommended by PT    Recommendations for Other Services OT consult     Precautions / Restrictions Precautions Precautions: Fall Precaution Comments: Watch L knee carefully for buckling Restrictions Weight Bearing Restrictions: Yes LLE Weight Bearing: Weight bearing as tolerated  Pt educated to not allow any pillow or bolster under knee for healing with optimal range of motion.      Mobility  Bed Mobility Overal bed mobility: Needs Assistance Bed Mobility: Supine to Sit     Supine to sit: Min guard     General bed mobility comments: Cues for technqiue  Transfers Overall transfer level: Needs assistance Equipment used: Rolling walker (2 wheeled) Transfers: Sit to/from Stand Sit to Stand: Min assist         General transfer comment: Min assist to steady at initial stand; cues to activate quad for stance stabiliyt  Ambulation/Gait Ambulation/Gait assistance: Mod assist Ambulation Distance  (Feet):  (pivot steps bed to chair) Assistive device: Rolling walker (2 wheeled) Gait Pattern/deviations: Shuffle     General Gait Details: Cues to activate L quad for stance stability; noted some buckling; mod assist to guard knee and help manage RW  Stairs            Wheelchair Mobility    Modified Rankin (Stroke Patients Only)       Balance Overall balance assessment: Needs assistance   Sitting balance-Leahy Scale: Fair       Standing balance-Leahy Scale: Poor                               Pertinent Vitals/Pain Pain Assessment: 0-10 Pain Score: 8  Pain Location: L knee Pain Descriptors / Indicators: Aching Pain Intervention(s): Monitored during session    Home Living Family/patient expects to be discharged to:: Private residence Living Arrangements: Spouse/significant other;Children Available Help at Discharge: Family;Available 24 hours/day Type of Home: Mobile home Home Access: Ramped entrance     Home Layout: One level Home Equipment: [place] - 2 wheels;Bedside commode      Prior Function Level of Independence: Independent               Hand Dominance        Extremity/Trunk Assessment   Upper Extremity Assessment: Generalized weakness           Lower Extremity Assessment: LLE deficits/detail   LLE Deficits / Details: Grossly decr aROM and strength, limtied by pain postop; positive quad activation, though weak     Communication   Communication: No difficulties  Cognition Arousal/Alertness: Lethargic;Suspect  due to medications Behavior During Therapy: [facility] for tasks assessed/performed (but quite sleepy) Overall Cognitive Status: Within Functional Limits for tasks assessed                      General Comments General comments (skin integrity, edema, etc.): Session conducted on Room Air; O2 sats stable    Exercises Total Joint Exercises Quad Sets: AROM;Left;5 reps Heel Slides: AAROM;Left;5 reps Straight  Leg Raises: AAROM;Left;5 reps Goniometric ROM: approx 8-60 deg   Assessment/Plan    PT Assessment Patient needs continued PT services  PT Problem List Decreased strength;Decreased range of motion;Decreased activity tolerance;Decreased balance;Decreased mobility;Decreased coordination;Decreased knowledge of use of DME;Decreased safety awareness;Decreased knowledge of precautions;Pain          PT Treatment Interventions DME instruction;Gait training;Stair training;Functional mobility training;Therapeutic activities;Therapeutic exercise;Patient/family education    PT Goals (Current goals can be found in the Care Plan section)  Acute Rehab PT Goals Patient Stated Goal: less pain PT Goal Formulation: With patient Time For Goal Achievement: 07/08/16 Potential to Achieve Goals: Good    Frequency 7X/week   Barriers to discharge        Co-evaluation               End of Session Equipment Utilized During Treatment: Gait belt Activity Tolerance: Patient limited by lethargy;Patient limited by pain Patient left: in chair;with call bell/phone within reach Nurse Communication: Mobility status         Time: [id number] PT Time Calculation (min) (ACUTE ONLY): 24 min   Charges:   PT Evaluation $PT Eval Moderate Complexity: 1 Procedure PT Treatments $Therapeutic Activity: 8-22 mins   PT G Codes:        Colletta [place] 07/01/2016, 4:43 PM  Roney [place], [place] Pager [phone] Office [phone]

## 2016-07-01 NOTE — Op Note (Signed)
PATIENT ID:      Sally Garrison  MRN:     [id number] DOB/AGE:    1965/12/16 / 50 y.o.       OPERATIVE REPORT    DATE OF PROCEDURE:  07/01/2016       PREOPERATIVE DIAGNOSIS:   LEFT KNEE OSTEOARTHRITIS      Estimated body mass index is 35.88 kg/m as calculated from the following:   Height as of this encounter: 5\' 1"  (1.549 m).   Weight as of this encounter: 86.1 kg (189 lb 14.4 oz).                                                        POSTOPERATIVE DIAGNOSIS:   LEFT KNEE OSTEOARTHRITIS                                                                      PROCEDURE:  Procedure(s): TOTAL KNEE ARTHROPLASTY Using DepuyAttune RP implants #5L Femur, #5Tibia, 5 mm Attune RP bearing, 35 Patella     SURGEON: Cantrell Martus J    ASSISTANT:   Eric K[employer]   (Present and scrubbed throughout the case, critical for assistance with exposure, retraction, instrumentation, and closure.)         ANESTHESIA: Spinal, 20cc Exparel, 50cc 0.25% Marcaine  EBL: 400  FLUID REPLACEMENT: 1600 crystalloid  TOURNIQUET TIME: 67min  Drains: None  Tranexamic Acid: 1gm iv, 2gm topical   COMPLICATIONS:  None         INDICATIONS FOR PROCEDURE: The patient has  LEFT KNEE OSTEOARTHRITIS, Var deformities, XR shows bone on bone arthritis, lateral subluxation of tibia. Patient has failed all conservative measures including anti-inflammatory medicines, narcotics, attempts at  exercise and weight loss, cortisone injections and viscosupplementation.  Risks and benefits of surgery have been discussed, questions answered.   DESCRIPTION OF PROCEDURE: The patient identified by armband, received  IV antibiotics, in the holding area at [facility]. Patient taken to the operating room, appropriate anesthetic  monitors were attached, and Spinal anesthesia was  induced. Tourniquet  applied high to the operative thigh. Lateral post and foot positioner  applied to the table, the lower extremity was then prepped and draped   in usual sterile fashion from the toes to the tourniquet. Time-out procedure was performed. We began the operation, with the knee flexed 120 degrees, by making the anterior midline incision starting at handbreadth above the patella going over the patella 1 cm medial to and 4 cm distal to the tibial tubercle. Small bleeders in the skin and the  subcutaneous tissue identified and cauterized. Transverse retinaculum was incised and reflected medially and a medial parapatellar arthrotomy was accomplished. the patella was everted and theprepatellar fat pad resected. The superficial medial collateral  ligament was then elevated from anterior to posterior along the proximal  flare of the tibia and anterior half of the menisci resected. The knee was hyperflexed exposing bone on bone arthritis. Peripheral and notch osteophytes as well as the cruciate ligaments were then resected. We continued to  work our way around posteriorly along  the proximal tibia, and externally  rotated the tibia subluxing it out from underneath the femur. A McHale  retractor was placed through the notch and a lateral Hohmann retractor  placed, and we then drilled through the proximal tibia in line with the  axis of the tibia followed by an intramedullary guide rod and 2-degree  posterior slope cutting guide. The tibial cutting guide, 3 degree posterior sloped, was pinned into place allowing resection of 5 mm of bone medially and 10 mm of bone laterally. Satisfied with the tibial resection, we then  entered the distal femur 2 mm anterior to the PCL origin with the  intramedullary guide rod and applied the distal femoral cutting guide  set at 9 mm, with 5 degrees of valgus. This was pinned along the  epicondylar axis. At this point, the distal femoral cut was accomplished without difficulty. We then sized for a #5L femoral component and pinned the guide in 3 degrees of external rotation. The chamfer cutting guide was pinned into place.  The anterior, posterior, and chamfer cuts were accomplished without difficulty followed by  the Attune RP box cutting guide and the box cut. We also removed posterior osteophytes from the posterior femoral condyles. At this  time, the knee was brought into full extension. We checked our  extension and flexion gaps and found them symmetric for a 5 mm bearing. Distracting in extension with a lamina spreader, the posterior horns of the menisci were removed, and Exparel, diluted to 60 cc, with 20cc NS, and 20cc 0.5% Marcaine,was injected into the capsule and synovium of the knee. The posterior patella cut was accomplished with the 9.5 mm Attune cutting guide, sized for a 41mm dome, and the fixation pegs drilled.The knee  was then once again hyperflexed exposing the proximal tibia. We sized for a # 5 tibial base plate, applied the smokestack and the conical reamer followed by the the Delta fin keel punch. We then hammered into place the Attune RP trial femoral component, drilled the lugs, inserted a  5 mm trial bearing, trial patellar button, and took the knee through range of motion from 0-130 degrees. No thumb pressure was required for patellar Tracking. At this point, the limb was wrapped with an Esmarch bandage and the tourniquet inflated to 350 mmHg. All trial components were removed, mating surfaces irrigated with pulse lavage, and dried with suction and sponges. A double batch of DePuy HV cement with 1500 mg of Zinacef was mixed and applied to all bony metallic mating surfaces except for the posterior condyles of the femur itself. In order, we  hammered into place the tibial tray and removed excess cement, the femoral component and removed excess cement. The final Attune RP bearing  was inserted, and the knee brought to full extension with compression.  The patellar button was clamped into place, and excess cement  removed. While the cement cured the wound was irrigated out with normal saline solution  pulse lavage. Ligament stability and patellar tracking were checked and found to be excellent. The parapatellar arthrotomy was closed with  running #1 Vicryl suture. The subcutaneous tissue with 0 and 2-0 undyed  Vicryl suture, and the skin with running 3-0 SQ vicryl. A dressing of Xeroform,  4 x 4, dressing sponges, Webril, and Ace wrap applied. The patient  awakened, and taken to recovery room without difficulty.   Dorcus Riga J 07/01/2016, 11:45 AM

## 2016-07-01 NOTE — Progress Notes (Signed)
Orthopedic Tech Progress Note Patient Details:  Sally Garrison Dec 08, 1965 [id number]  CPM Left Knee CPM Left Knee: On Left Knee Flexion (Degrees): 40 Left Knee Extension (Degrees): 10 Additional Comments: Left Knee. Bone Foam (zero degree foam), Applied CPM 10-40 and Applied Trapeze Bar Patient Helper   Kristopher Oppenheim 07/01/2016, 12:51 PM

## 2016-07-02 ENCOUNTER — Encounter (HOSPITAL_COMMUNITY): Payer: Self-pay | Admitting: Orthopedic Surgery

## 2016-07-02 LAB — BASIC METABOLIC PANEL
Anion gap: 6 (ref 5–15)
BUN: 5 mg/dL — AB (ref 6–20)
CALCIUM: 8.6 mg/dL — AB (ref 8.9–10.3)
CHLORIDE: 108 mmol/L (ref 101–111)
CO2: 22 mmol/L (ref 22–32)
CREATININE: 0.73 mg/dL (ref 0.44–1.00)
GFR calc non Af Amer: >60 mL/min (ref >60)
GLUCOSE: 133 mg/dL — AB (ref 65–99)
Potassium: 4.1 mmol/L (ref 3.5–5.1)
Sodium: 136 mmol/L (ref 135–145)

## 2016-07-02 LAB — CBC
HEMATOCRIT: 39 % (ref 36.0–46.0)
HEMOGLOBIN: 12.1 g/dL (ref 12.0–15.0)
MCH: 28.5 pg (ref 26.0–34.0)
MCHC: 31 g/dL (ref 30.0–36.0)
MCV: 91.8 fL (ref 78.0–100.0)
Platelets: 178 10*3/uL (ref 150–400)
RBC: 4.25 MIL/uL (ref 3.87–5.11)
RDW: 15.9 % — AB (ref 11.5–15.5)
WBC: 7.7 10*3/uL (ref 4.0–10.5)

## 2016-07-02 NOTE — Progress Notes (Signed)
Physical Therapy Treatment Patient Details Name: Sally Garrison MRN: [id number] DOB: 09/30/1965 Today's Date: 07/02/2016    History of Present Illness Admitted for LTKA, WBAT;  has a pertinent past medical history of Anemia; Anxiety; Arthritis; Asthma; Cancer ([place]); Chronic kidney disease; Depression; Diabetes mellitus without complication (HCC);  PONV (postoperative nausea and vomiting); PTSD (post-traumatic stress disorder);  has a pertinent past surgical history that includes  Shoulder open rotator cuff repair (Right); and Total knee arthroplasty (Right, 02/28/2014).    PT Comments    Session focused on progressive ambulation and knee control while standing; Noting progress with activity tolerance, but Sally Garrison was still quite sleepy even while walking the hallway; Mild L knee buckling, improved with cues;   Would like to confirm that she has 24 hour assist at home;   Will plan to focus on therex next session.   Follow Up Recommendations  Home health PT;Supervision/Assistance - 24 hour     Equipment Recommendations  None recommended by PT    Recommendations for Other Services OT consult     Precautions / Restrictions Precautions Precautions: Fall Restrictions Weight Bearing Restrictions: Yes LLE Weight Bearing: Weight bearing as tolerated  Pt educated to not allow any pillow or bolster under knee for healing with optimal range of motion.    Mobility  Bed Mobility Overal bed mobility: Needs Assistance Bed Mobility: Supine to Sit     Supine to sit: Min guard     General bed mobility comments: Cues for technqiue  Transfers Overall transfer level: Needs assistance Equipment used: Rolling walker (2 wheeled) Transfers: Sit to/from Stand Sit to Stand: Min assist         General transfer comment: Min assist to steady at initial stand; cues to activate quad for stance stability  Ambulation/Gait Ambulation/Gait assistance: Mod assist;Min assist;+2  safety/equipment Ambulation Distance (Feet): 60 Feet Assistive device: Rolling walker (2 wheeled) Gait Pattern/deviations: Decreased step length - left;Decreased stance time - right;Trunk flexed   Gait velocity interpretation: Below normal speed for age/gender General Gait Details: Cues to activate L quad for stance stability; noted some buckling; mod assist to guard knee and help manage RW; verbal and tactile cueing for upright posture, incr weight shift onto LLE during stance, incr L hip extension in stance; and to incr step length R; eyes closed approx 50% of session   Stairs            Wheelchair Mobility    Modified Rankin (Stroke Patients Only)       Balance Overall balance assessment: Needs assistance   Sitting balance-Leahy Scale: Fair       Standing balance-Leahy Scale: Poor                      Cognition Arousal/Alertness: Lethargic;Suspect due to medications (But slightly more awake than POD zero) Behavior During Therapy: WFL for tasks assessed/performed Overall Cognitive Status: Within Functional Limits for tasks assessed                      Exercises      General Comments        Pertinent Vitals/Pain Pain Assessment: 0-10 Pain Score: 8  Pain Location: L knee Pain Descriptors / Indicators: Aching Pain Intervention(s): Monitored during session;Patient requesting pain meds-RN notified    Home Living                      Prior Function  PT Goals (current goals can now be found in the care plan section) Acute Rehab PT Goals Patient Stated Goal: less pain PT Goal Formulation: With patient Time For Goal Achievement: 07/08/16 Potential to Achieve Goals: Good Progress towards PT goals: Progressing toward goals    Frequency    7X/week      PT Plan Current plan remains appropriate    Co-evaluation             End of Session Equipment Utilized During Treatment: Gait belt Activity Tolerance:  Patient limited by lethargy;Patient limited by pain Patient left: in chair;with call bell/phone within reach     Time: 0911-0930 PT Time Calculation (min) (ACUTE ONLY): 19 min  Charges:  $Gait Training: 8-22 mins                    G Codes:      Colletta Maryland 14-Jul-2016, 11:11 AM  Roney Marion, PT  Acute Rehabilitation Services Pager [phone] Office [phone]

## 2016-07-02 NOTE — Care Management Note (Signed)
Case Management Note  Patient Details  Name: Sally Garrison MRN: [id number] Date of Birth: 03-Aug-1966  Subjective/Objective:   50 yr old female s/p left total knee arthroplasty.  Action/Plan: Case manager spoke with patient concerning discharge plan and Home Health needs. Patient was preoperatively setup with [facility], no changes. Patient states she has rolling walker, 3in1, CPM has been delivered. She will have family support at discharge.    Expected Discharge Date:  07/03/16               Expected Discharge Plan:  [place]  In-House Referral:  NA  Discharge planning Services  CM Consult  Post Acute Care Choice:  Home Health Choice offered to:  Patient  DME Arranged:  CPM DME Agency:  [employer]  HH Arranged:  PT Halfway:  [facility] (now [facility])  Status of Service:  Completed, signed off  If discussed at [employer] of Stay Meetings, dates discussed:    Additional Comments:  Ninfa Meeker, RN 07/02/2016, 1:00 PM

## 2016-07-02 NOTE — Evaluation (Signed)
Occupational Therapy Evaluation Patient Details Name: Sally Garrison MRN: [id number] DOB: 03-16-1966 Today's Date: 07/02/2016    History of Present Illness Admitted for LTKA, WBAT;  has a pertinent past medical history of Anemia; Anxiety; Arthritis; Asthma; Cancer ([place]); Chronic kidney disease; Depression; Diabetes mellitus without complication (HCC);  PONV (postoperative nausea and vomiting); PTSD (post-traumatic stress disorder);  has a pertinent past surgical history that includes  Shoulder open rotator cuff repair (Right); and Total knee arthroplasty (Right, 02/28/2014).   Clinical Impression   Pt admitted with above and presents to OT with deficits listed below (see OT Problem List) impacting her ability to complete ADLs independently.  Initially attempted to see pt at 1322, but pt falling asleep providing therapist with information about home setup and d/c plan.  Therapist returned to find pt more alert and eating lunch.  Pt completed simulated toilet transfer with min assist with tactile cues for weight shifting and increased time with all movements due to pain.  Pt ambulated approx 80 feet with RW and min guard with improved Lt knee stability.  Returned to room and left upright in recliner.  Pt will benefit from OT acutely to decrease burden of care with LB ADLs and transfers to prepare for d/c home with [facility].    Follow Up Recommendations  Home health OT    Equipment Recommendations  None recommended by OT    Recommendations for Other Services       Precautions / Restrictions Precautions Precautions: Fall Precaution Comments: Watch L knee carefully for buckling Restrictions Weight Bearing Restrictions: Yes LLE Weight Bearing: Weight bearing as tolerated      Mobility Bed Mobility Overal bed mobility: Needs Assistance Bed Mobility: Supine to Sit     Supine to sit: Min guard     General bed mobility comments: Cues for technqiue  Transfers Overall transfer level:  Needs assistance Equipment used: Rolling walker (2 wheeled) Transfers: Sit to/from Stand Sit to Stand: Min assist         General transfer comment: Min assist to steady at initial stand with cues for weight shift and control with lowering to sit         ADL Overall ADL's : Needs assistance/impaired     Grooming: Brushing hair;Independent                   Toilet Transfer: Minimal assistance;Ambulation;RW Toilet Transfer Details (indicate cue type and reason): Completed transfer to toilet with RW, min assist for weight shift/stability during sit <> stand Toileting- Clothing Manipulation and Hygiene: Min guard;Sit to/from stand       Functional mobility during ADLs: Rolling walker;Min guard;Minimal assistance General ADL Comments: Min assist for sit <> stand, min guard with ambulation with use of RW     Vision Vision Assessment?: No apparent visual deficits          Pertinent Vitals/Pain Pain Assessment: 0-10 Pain Score: 7  Pain Location: Lt knee Pain Descriptors / Indicators: Aching Pain Intervention(s): Monitored during session;Repositioned          Extremity/Trunk Assessment Upper Extremity Assessment Upper Extremity Assessment: Generalized weakness           Communication Communication Communication: No difficulties   Cognition Arousal/Alertness: Lethargic;Suspect due to medications (more awake than initial attempt) Behavior During Therapy: WFL for tasks assessed/performed Overall Cognitive Status: Within Functional Limits for tasks assessed  Home Living Family/patient expects to be discharged to:: Private residence Living Arrangements: Spouse/significant other;Children Available Help at Discharge: Family;Available 24 hours/day Type of Home: Mobile home Home Access: Ramped entrance     Home Layout: One level     Bathroom Shower/Tub: Tub/shower unit Shower/tub characteristics: Curtain Armed forces operational officer: Standard     Home Equipment: Environmental consultant - 2 wheels;Bedside commode          Prior Functioning/Environment Level of Independence: Independent                 OT Problem List: Decreased range of motion;Decreased activity tolerance;Impaired balance (sitting and/or standing);Decreased knowledge of use of DME or AE;Pain   OT Treatment/Interventions: Self-care/ADL training;Energy conservation;DME and/or AE instruction;Therapeutic activities;Patient/family education    OT Goals(Current goals can be found in the care plan section) Acute Rehab OT Goals Patient Stated Goal: to move with less pain OT Goal Formulation: With patient Time For Goal Achievement: 07/16/16 Potential to Achieve Goals: Good  OT Frequency: Min 2X/week    End of Session Equipment Utilized During Treatment: Gait belt;Rolling walker Nurse Communication: Mobility status;Patient requests pain meds  Activity Tolerance: Patient tolerated treatment well;No increased pain Patient left: in chair;with call bell/phone within reach   Time: [id number] OT Time Calculation (min): 24 min Charges:  OT General Charges $OT Visit: 1 Procedure OT Evaluation $OT Eval Moderate Complexity: 1 Procedure OT Treatments $Therapeutic Activity: 8-22 mins G-CodesSimonne Come, [id number] 07/02/2016, 3:16 PM

## 2016-07-02 NOTE — Progress Notes (Signed)
Physical Therapy Treatment Patient Details Name: Sally Garrison MRN: [id number] DOB: 18-Jun-1966 Today's Date: 07/02/2016    History of Present Illness Admitted for LTKA, WBAT;  has a pertinent past medical history of Anemia; Anxiety; Arthritis; Asthma; Cancer ([place]); Chronic kidney disease; Depression; Diabetes mellitus without complication (HCC);  PONV (postoperative nausea and vomiting); PTSD (post-traumatic stress disorder);  has a pertinent past surgical history that includes  Shoulder open rotator cuff repair (Right); and Total knee arthroplasty (Right, 02/28/2014).    PT Comments    Still sleepy during session, but able to participate, incr amb distance, and participate in therex  Follow Up Recommendations  Home health PT;Supervision/Assistance - 24 hour     Equipment Recommendations  None recommended by PT    Recommendations for Other Services       Precautions / Restrictions Precautions Precautions: Fall Precaution Comments: Watch L knee carefully for buckling Restrictions Weight Bearing Restrictions: Yes LLE Weight Bearing: Weight bearing as tolerated  Pt educated to not allow any pillow or bolster under knee for healing with optimal range of motion.     Mobility  Bed Mobility Overal bed mobility: Needs Assistance Bed Mobility: Supine to Sit;Sit to Supine     Supine to sit: Min guard Sit to supine: Min guard   General bed mobility comments: Cues for technqiue  Transfers Overall transfer level: Needs assistance Equipment used: Rolling walker (2 wheeled) Transfers: Sit to/from Stand Sit to Stand: Min assist         General transfer comment: Min assist to steady at initial stand with cues for weight shift and control with lowering to sit  Ambulation/Gait Ambulation/Gait assistance: Min assist Ambulation Distance (Feet): 80 Feet Assistive device: Rolling walker (2 wheeled) Gait Pattern/deviations: Step-to pattern;Decreased weight shift to left;Decreased  step length - right   Gait velocity interpretation: Below normal speed for age/gender General Gait Details: Cues to activate L quad for stance stability; noted some buckling; mod assist to guard knee and help manage RW; verbal and tactile cueing for upright posture, incr weight shift onto LLE during stance, incr L hip extension in stance; and to incr step length R; eyes closed approx 50% of session   Stairs            Wheelchair Mobility    Modified Rankin (Stroke Patients Only)       Balance                                    Cognition Arousal/Alertness: Lethargic;Suspect due to medications (But arousable; eyes closed approx 1/3 of session) Behavior During Therapy: WFL for tasks assessed/performed Overall Cognitive Status: Within Functional Limits for tasks assessed                      Exercises Total Joint Exercises Quad Sets: AROM;Left;10 reps Short Arc QuadSinclair Garrison;Left;10 reps Heel Slides: AAROM;Left;10 reps Straight Leg Raises: AAROM;Left;10 reps Goniometric ROM: approx 5-80 deg passively    General Comments        Pertinent Vitals/Pain Pain Assessment: 0-10 Pain Score: 7  Pain Location: L knee Pain Descriptors / Indicators: Aching Pain Intervention(s): Monitored during session    Home Living Family/patient expects to be discharged to:: Private residence Living Arrangements: Spouse/significant other;Children Available Help at Discharge: Family;Available 24 hours/day Type of Home: Mobile home Home Access: Ramped entrance   Home Layout: One level Home Equipment: [place] - 2 wheels;Bedside commode  Prior Function Level of Independence: Independent          PT Goals (current goals can now be found in the care plan section) Acute Rehab PT Goals Patient Stated Goal: to move with less pain PT Goal Formulation: With patient Time For Goal Achievement: 07/08/16 Potential to Achieve Goals: Good Progress towards PT goals:  Progressing toward goals    Frequency    7X/week      PT Plan Current plan remains appropriate    Co-evaluation             End of Session Equipment Utilized During Treatment: Gait belt Activity Tolerance: Patient limited by lethargy;Patient limited by pain Patient left: in bed;with call bell/phone within reach     Time: 1348-1420 PT Time Calculation (min) (ACUTE ONLY): 32 min  Charges:  $Gait Training: 8-22 mins $Therapeutic Exercise: 8-22 mins                    G Codes:      Sally Garrison July 11, 2016, 4:42 PM   Roney Marion, [place] Pager [phone] Office [phone]

## 2016-07-02 NOTE — Progress Notes (Signed)
Orthopedic Tech Progress Note Patient Details:  Sally Garrison [birth year] [id number]  Patient ID: [name], female   DOB: 12-05-1965, 50 y.o.   MRN: [id number] Pt already on cpm.  Karolee Stamps 07/02/2016, 5:30 AM

## 2016-07-02 NOTE — Progress Notes (Signed)
PATIENT ID: Felecia Nard Meloni  MRN: [id number]  DOB/AGE:  [birth year] / 50 y.o.  1 Day Post-Op Procedure(s) (LRB): TOTAL KNEE ARTHROPLASTY (Left)    PROGRESS NOTE Subjective: Patient is alert, oriented, no Nausea, no Vomiting, yes passing gas. Taking PO well. Denies SOB, Chest or Calf Pain. Using Incentive Spirometer, PAS in place. Ambulate WBAT,, Has been up to bathroom, CPM 0-40 Patient reports pain as 5/10 .    Objective: Vital signs in last 24 hours: Vitals:   07/01/16 1500 07/01/16 1950 07/02/16 0019 07/02/16 0500  BP: 119/65 138/81 128/77 137/77  Pulse: (!) 56 67 69 73  Resp: 16 16 16 16   Temp: 97.4 F (36.3 C) 97.9 F (36.6 C) 99.3 F (37.4 C) 99.2 F (37.3 C)  TempSrc: Oral Oral Oral Oral  SpO2: 97% 98% 95% 95%  Weight:      Height:          Intake/Output from previous day: I/O last 3 completed shifts: In: 1925 [P.O.:360; I.V.:1455; IV Piggyback:110] Out: 200 [Blood:200]   Intake/Output this shift: No intake/output data recorded.   LABORATORY DATA:  Recent Labs  07/01/16 1203 07/02/16 0440  WBC  --  7.7  HGB  --  12.1  HCT  --  39.0  PLT  --  178  NA  --  136  K  --  4.1  CL  --  108  CO2  --  22  BUN  --  5*  CREATININE  --  0.73  GLUCOSE  --  133*  GLUCAP 83  --   CALCIUM  --  8.6*    Examination: Neurologically intact ABD soft Neurovascular intact Sensation intact distally Intact pulses distally Dorsiflexion/Plantar flexion intact Incision: dressing C/D/I No cellulitis present Compartment soft}  Assessment:   1 Day Post-Op Procedure(s) (LRB): TOTAL KNEE ARTHROPLASTY (Left) ADDITIONAL DIAGNOSIS: Expected Acute Blood Loss Anemia, History of migraines, history of restless leg syndrome, history of gastric ulcer  Plan: PT/OT WBAT, CPM 5/hrs day until ROM 0-90 degrees, then D/C CPM DVT Prophylaxis:  SCDx72hrs, ASA 325 mg BID x 2 weeks DISCHARGE PLAN: Home DISCHARGE NEEDS: HHPT, CPM, Walker and 3-in-1 comode seat     Alexxander Kurt  J 07/02/2016, 6:59 AM

## 2016-07-03 ENCOUNTER — Encounter (HOSPITAL_COMMUNITY): Payer: Self-pay | Admitting: General Practice

## 2016-07-03 LAB — CBC
HEMATOCRIT: 39 % (ref 36.0–46.0)
Hemoglobin: 12.4 g/dL (ref 12.0–15.0)
MCH: 28.6 pg (ref 26.0–34.0)
MCHC: 31.8 g/dL (ref 30.0–36.0)
MCV: 90.1 fL (ref 78.0–100.0)
PLATELETS: 191 10*3/uL (ref 150–400)
RBC: 4.33 MIL/uL (ref 3.87–5.11)
RDW: 15.6 % — AB (ref 11.5–15.5)
WBC: 11 10*3/uL — AB (ref 4.0–10.5)

## 2016-07-03 NOTE — Progress Notes (Signed)
Orthopedic Tech Progress Note Patient Details:  Sally Garrison [birth year] [id number]  Patient ID: [name], female   DOB: 1966-07-31, 50 y.o.   MRN: [id number] Applied cpm 0-40  Karolee Stamps 07/03/2016, 5:52 AM

## 2016-07-03 NOTE — Discharge Summary (Signed)
Patient ID: Sally Garrison MRN: [id number] DOB/AGE: [birth year] 50 y.o.  Admit date: 07/01/2016 Discharge date: 07/03/2016  Admission Diagnoses:  Principal Problem:   Primary osteoarthritis of left knee Active Problems:   Primary localized osteoarthritis of left knee   Discharge Diagnoses:  Same  Past Medical History:  Diagnosis Date  . Anemia    on monthly period since June  . Anxiety   . Arthritis   . Asthma   . Bronchitis   . Cancer ([place])    cervical Cancer  . Chronic kidney disease    kidney stones  . Depression   . Diabetes mellitus without complication ([place])    had gastric by-pass not on meds  . GERD (gastroesophageal reflux disease)   . Headache(784.0)    migraines  . PONV (postoperative nausea and vomiting)   . PTSD (post-traumatic stress disorder)   . Shortness of breath   . Sleep apnea    not using CPAP  . Thyroid nodule     Surgeries: Procedure(s): TOTAL KNEE ARTHROPLASTY on 07/01/2016   Consultants:   Discharged Condition: Improved  Hospital Course: Sally Garrison is an 50 y.o. female who was admitted 07/01/2016 for operative treatment ofPrimary osteoarthritis of left knee. Patient has severe unremitting pain that affects sleep, daily activities, and work/hobbies. After pre-op clearance the patient was taken to the operating room on 07/01/2016 and underwent  Procedure(s): TOTAL KNEE ARTHROPLASTY.    Patient was given perioperative antibiotics: Anti-infectives    Start     Dose/Rate Route Frequency Ordered Stop   07/01/16 1052  vancomycin (VANCOCIN) powder  Status:  Discontinued       As needed 07/01/16 1052 07/01/16 1158   07/01/16 0915  vancomycin (VANCOCIN) 1,500 mg in sodium chloride 0.9 % 500 mL IVPB     1,500 mg 250 mL/hr over 120 Minutes Intravenous To ShortStay Surgical 06/30/16 1308 07/01/16 1055       Patient was given sequential compression devices, early ambulation, and chemoprophylaxis to prevent DVT.  Patient benefited maximally from  hospital stay and there were no complications.    Recent vital signs: Patient Vitals for the past 24 hrs:  BP Temp Temp src Pulse Resp SpO2  07/03/16 0402 125/64 98.7 F (37.1 C) Oral 82 16 95 %  07/02/16 2111 138/74 99.3 F (37.4 C) Oral 85 16 99 %  07/02/16 1500 122/72 99 F (37.2 C) - 80 16 94 %  07/02/16 1200 136/63 99.7 F (37.6 C) Oral 67 16 98 %  07/02/16 0900 (!) 121/57 99.6 F (37.6 C) Oral 67 14 95 %     Recent laboratory studies:  Recent Labs  07/02/16 0440 07/03/16 0601  WBC 7.7 11.0*  HGB 12.1 12.4  HCT 39.0 39.0  PLT 178 191  NA 136  --   K 4.1  --   CL 108  --   CO2 22  --   BUN 5*  --   CREATININE 0.73  --   GLUCOSE 133*  --   CALCIUM 8.6*  --      Discharge Medications:     Medication List    TAKE these medications   acetaminophen 325 MG tablet Commonly known as:  TYLENOL Take 325-650 mg by mouth every 6 (six) hours as needed for mild pain, fever or headache.   apixaban 2.5 MG Tabs tablet Commonly known as:  ELIQUIS Take 1 tablet (2.5 mg total) by mouth 2 (two) times daily.   clonazePAM 0.5 MG tablet Commonly  known as:  KLONOPIN Take 0.25 mg by mouth every 8 (eight) hours.   cyanocobalamin 1000 MCG/ML injection Commonly known as:  (VITAMIN B-12) Inject 1,000 mcg into the muscle every 30 (thirty) days.   diclofenac sodium 1 % Gel Commonly known as:  VOLTAREN Apply 1 g topically 2 (two) times daily.   dicyclomine 10 MG capsule Commonly known as:  BENTYL Take 10 mg by mouth 4 (four) times daily as needed (for IBS symptoms).   ferrous sulfate 324 (65 Fe) MG Tbec Take 324 mg by mouth daily.   FLUoxetine 20 MG capsule Commonly known as:  PROZAC Take 20 mg by mouth daily.   fluticasone 50 MCG/ACT nasal spray Commonly known as:  FLONASE Place 1-2 sprays into both nostrils daily as needed for allergies.   levocetirizine 5 MG tablet Commonly known as:  XYZAL Take 5 mg by mouth daily.   methocarbamol 500 MG tablet Commonly known  as:  ROBAXIN Take 1 tablet (500 mg total) by mouth 2 (two) times daily with a meal.   oxyCODONE-acetaminophen 5-325 MG tablet Commonly known as:  ROXICET Take 1 tablet by mouth every 4 (four) hours as needed.   pantoprazole 40 MG tablet Commonly known as:  PROTONIX Take 40 mg by mouth 2 (two) times daily.   rOPINIRole 1 MG tablet Commonly known as:  REQUIP Take 1 mg by mouth at bedtime.   SUMAtriptan 50 MG tablet Commonly known as:  IMITREX Take 50-100 mg by mouth every 2 (two) hours as needed for migraine. May repeat in 2 hours if headache persists or recurs.   tobramycin-dexamethasone ophthalmic solution Commonly known as:  TOBRADEX Place 3 drops into the right eye 3 (three) times daily as needed (for eye irritation).   topiramate 50 MG tablet Commonly known as:  TOPAMAX Take 75 mg by mouth 2 (two) times daily.            Durable Medical Equipment        Start     Ordered   07/01/16 1203  DME Walker rolling  Once    Question:  Patient needs a walker to treat with the following condition  Answer:  H/O total knee replacement, left   07/01/16 1205   07/01/16 1203  DME 3 n 1  Once     07/01/16 1205   07/01/16 1203  DME Bedside commode  Once    Question:  Patient needs a bedside commode to treat with the following condition  Answer:  H/O total knee replacement, left   07/01/16 1205      Diagnostic Studies: Dg Chest 2 View  Result Date: 06/26/2016 CLINICAL DATA:  Preoperative evaluation for total knee replacement. Cough. EXAM: CHEST  2 VIEW COMPARISON:  February 15, 2014 FINDINGS: Lungs are clear. Heart size and pulmonary vascularity are normal. No adenopathy. No bone lesions. There are surgical clips in the right upper abdomen. IMPRESSION: No edema or consolidation. Electronically Signed   By: Lowella Grip III M.D.   On: 06/26/2016 14:42    Disposition: 06-Home-Health Care Svc  Discharge Instructions    CPM    Complete by:  As directed    Continuous passive  motion machine (CPM):      Use the CPM from 0 to 60  for 5 hours per day.      You may increase by 10 degrees per day.  You may break it up into 2 or 3 sessions per day.      Use CPM  for 2 weeks or until you are told to stop.   Call MD / Call 911    Complete by:  As directed    If you experience chest pain or shortness of breath, CALL 911 and be transported to the hospital emergency room.  If you develope a fever above 101 F, pus (white drainage) or increased drainage or redness at the wound, or calf pain, call your surgeon's office.   Constipation Prevention    Complete by:  As directed    Drink plenty of fluids.  Prune juice may be helpful.  You may use a stool softener, such as Colace (over the counter) 100 mg twice a day.  Use MiraLax (over the counter) for constipation as needed.   Diet - low sodium heart healthy    Complete by:  As directed    Driving restrictions    Complete by:  As directed    No driving for 2 weeks   Increase activity slowly as tolerated    Complete by:  As directed    Patient may shower    Complete by:  As directed    You may shower without a dressing once there is no drainage.  Do not wash over the wound.  If drainage remains, cover wound with plastic wrap and then shower.      Follow-up Information    Kerin Salen, MD Follow up in 2 week(s).   Specialty:  Orthopedic Surgery Contact information: [place] [address] [address] [facility] up.   Specialty:  [place] Why:  Someone from [facility] will contact you to arrange start date and time for therapy. Contact information: [address] [place] [address] [phone]            Signed: Hardin Negus, Kazi Reppond R 07/03/2016, 8:06 AM

## 2016-07-03 NOTE — Progress Notes (Signed)
PATIENT ID: Sally Garrison  MRN: [id number]  DOB/AGE:  01-25-1966 / 50 y.o.  2 Days Post-Op Procedure(s) (LRB): TOTAL KNEE ARTHROPLASTY (Left)    PROGRESS NOTE Subjective: Patient is alert, oriented, no Nausea, no Vomiting, yes passing gas. Taking PO well. Denies SOB, Chest or Calf Pain. Using Incentive Spirometer, PAS in place. Ambulate WBAT with pt walking 80 ft, CPM 0-40 Patient reports pain as 8/10 with walking and cpm .    Objective: Vital signs in last 24 hours: Vitals:   07/02/16 1200 07/02/16 1500 07/02/16 2111 07/03/16 0402  BP: 136/63 122/72 138/74 125/64  Pulse: 67 80 85 82  Resp: 16 16 16 16   Temp: 99.7 F (37.6 C) 99 F (37.2 C) 99.3 F (37.4 C) 98.7 F (37.1 C)  TempSrc: Oral  Oral Oral  SpO2: 98% 94% 99% 95%  Weight:      Height:          Intake/Output from previous day: I/O last 3 completed shifts: In: 2821.7 [P.O.:240; I.V.:2581.7] Out: -    Intake/Output this shift: No intake/output data recorded.   LABORATORY DATA:  Recent Labs  07/01/16 1203 07/02/16 0440 07/03/16 0601  WBC  --  7.7 11.0*  HGB  --  12.1 12.4  HCT  --  39.0 39.0  PLT  --  178 191  NA  --  136  --   K  --  4.1  --   CL  --  108  --   CO2  --  22  --   BUN  --  5*  --   CREATININE  --  0.73  --   GLUCOSE  --  133*  --   GLUCAP 83  --   --   CALCIUM  --  8.6*  --     Examination: Neurologically intact Neurovascular intact Sensation intact distally Intact pulses distally Dorsiflexion/Plantar flexion intact Incision: dressing C/D/I and scant drainage No cellulitis present Compartment soft}  Assessment:   2 Days Post-Op Procedure(s) (LRB): TOTAL KNEE ARTHROPLASTY (Left) ADDITIONAL DIAGNOSIS: Expected Acute Blood Loss Anemia,  History of migraines, history of restless leg syndrome, history of gastric ulcer  Plan: PT/OT WBAT, CPM 5/hrs day until ROM 0-90 degrees, then D/C CPM DVT Prophylaxis:  SCDx72hrs, ASA 325 mg BID x 2 weeks DISCHARGE PLAN: Home DISCHARGE NEEDS:  HHPT, CPM, Walker and 3-in-1 comode seat     Kenderick Kobler R 07/03/2016, 8:03 AM

## 2016-07-03 NOTE — Progress Notes (Signed)
Physical Therapy Treatment Patient Details Name: Sally Garrison MRN: [id number] DOB: 1966-03-15 Today's Date: 07/03/2016    History of Present Illness Admitted for LTKA, WBAT;  has a pertinent past medical history of Anemia; Anxiety; Arthritis; Asthma; Cancer ([place]); Chronic kidney disease; Depression; Diabetes mellitus without complication ([facility]);  PONV (postoperative nausea and vomiting); PTSD (post-traumatic stress disorder);  has a pertinent past surgical history that includes  Shoulder open rotator cuff repair (Right); and Total knee arthroplasty (Right, 02/28/2014).    PT Comments    Session focused on functional mobility activities and progressive amb in prep for dc home today; Much more awake and noted very good improvements in gait distance; OK for dc home from PT standpoint   Follow Up Recommendations  Home health PT;Supervision/Assistance - 24 hour     Equipment Recommendations  None recommended by PT    Recommendations for Other Services       Precautions / Restrictions Precautions Precautions: Fall Precaution Comments: Fall risk greatly decr as pt is much more awake Restrictions LLE Weight Bearing: Weight bearing as tolerated  Pt educated to not allow any pillow or bolster under knee for healing with optimal range of motion.    Mobility  Bed Mobility Overal bed mobility: Needs Assistance Bed Mobility: Supine to Sit     Supine to sit: Supervision     General bed mobility comments: Supervision for safety  Transfers Overall transfer level: Needs assistance Equipment used: Rolling walker (2 wheeled) Transfers: Sit to/from Stand Sit to Stand: Min guard (without physical contact)         General transfer comment: Overall good rise and correct hand placement; not needing physical assist to steady  Ambulation/Gait Ambulation/Gait assistance: Supervision Ambulation Distance (Feet): 250 Feet Assistive device: Rolling walker (2 wheeled) Gait Pattern/deviations:  Step-through pattern;Decreased stance time - left;Decreased step length - right;Trunk flexed Gait velocity: slow   General Gait Details: Cues to self-monitor for activity tolerance, and to stand tall on LLE   Stairs            Wheelchair Mobility    Modified Rankin (Stroke Patients Only)       Balance     Sitting balance-Leahy Scale: Fair       Standing balance-Leahy Scale: Fair                      Cognition Arousal/Alertness: Awake/alert Behavior During Therapy: WFL for tasks assessed/performed;Flat affect Overall Cognitive Status: Within Functional Limits for tasks assessed                      Exercises      General Comments        Pertinent Vitals/Pain Pain Assessment: 0-10 Pain Score: 8  Pain Location: L knee Pain Descriptors / Indicators: Aching Pain Intervention(s): Premedicated before session    Home Living Family/patient expects to be discharged to:: Private residence Living Arrangements: Spouse/significant other;Children;Other relatives                  Prior Function            PT Goals (current goals can now be found in the care plan section) Acute Rehab PT Goals Patient Stated Goal: to move with less pain PT Goal Formulation: With patient Time For Goal Achievement: 07/08/16 Potential to Achieve Goals: Good Progress towards PT goals: Progressing toward goals    Frequency    7X/week      PT Plan Current plan  remains appropriate    Co-evaluation             End of Session Equipment Utilized During Treatment: Gait belt Activity Tolerance: Patient tolerated treatment well Patient left: in chair;with call bell/phone within reach     Time: 0945-1018 PT Time Calculation (min) (ACUTE ONLY): 33 min  Charges:  $Gait Training: 23-37 mins                    G Codes:      Sally Garrison 07-26-16, 10:27 AM  Sally Garrison, [place] Pager [phone] Office  [phone]

## 2016-07-04 DIAGNOSIS — Z471 Aftercare following joint replacement surgery: Secondary | ICD-10-CM | POA: Diagnosis not present

## 2016-07-04 DIAGNOSIS — M1711 Unilateral primary osteoarthritis, right knee: Secondary | ICD-10-CM | POA: Diagnosis not present

## 2016-07-04 DIAGNOSIS — G2581 Restless legs syndrome: Secondary | ICD-10-CM | POA: Diagnosis not present

## 2016-07-04 DIAGNOSIS — G4730 Sleep apnea, unspecified: Secondary | ICD-10-CM | POA: Diagnosis not present

## 2016-07-04 DIAGNOSIS — E1122 Type 2 diabetes mellitus with diabetic chronic kidney disease: Secondary | ICD-10-CM | POA: Diagnosis not present

## 2016-07-04 DIAGNOSIS — J40 Bronchitis, not specified as acute or chronic: Secondary | ICD-10-CM | POA: Diagnosis not present

## 2016-07-05 DIAGNOSIS — E1122 Type 2 diabetes mellitus with diabetic chronic kidney disease: Secondary | ICD-10-CM | POA: Diagnosis not present

## 2016-07-05 DIAGNOSIS — G4730 Sleep apnea, unspecified: Secondary | ICD-10-CM | POA: Diagnosis not present

## 2016-07-05 DIAGNOSIS — Z471 Aftercare following joint replacement surgery: Secondary | ICD-10-CM | POA: Diagnosis not present

## 2016-07-05 DIAGNOSIS — G2581 Restless legs syndrome: Secondary | ICD-10-CM | POA: Diagnosis not present

## 2016-07-05 DIAGNOSIS — M1711 Unilateral primary osteoarthritis, right knee: Secondary | ICD-10-CM | POA: Diagnosis not present

## 2016-07-05 DIAGNOSIS — J40 Bronchitis, not specified as acute or chronic: Secondary | ICD-10-CM | POA: Diagnosis not present

## 2016-07-09 DIAGNOSIS — G2581 Restless legs syndrome: Secondary | ICD-10-CM | POA: Diagnosis not present

## 2016-07-09 DIAGNOSIS — M1711 Unilateral primary osteoarthritis, right knee: Secondary | ICD-10-CM | POA: Diagnosis not present

## 2016-07-09 DIAGNOSIS — G4730 Sleep apnea, unspecified: Secondary | ICD-10-CM | POA: Diagnosis not present

## 2016-07-09 DIAGNOSIS — J40 Bronchitis, not specified as acute or chronic: Secondary | ICD-10-CM | POA: Diagnosis not present

## 2016-07-09 DIAGNOSIS — Z471 Aftercare following joint replacement surgery: Secondary | ICD-10-CM | POA: Diagnosis not present

## 2016-07-09 DIAGNOSIS — E1122 Type 2 diabetes mellitus with diabetic chronic kidney disease: Secondary | ICD-10-CM | POA: Diagnosis not present

## 2016-07-10 DIAGNOSIS — J40 Bronchitis, not specified as acute or chronic: Secondary | ICD-10-CM | POA: Diagnosis not present

## 2016-07-10 DIAGNOSIS — E1122 Type 2 diabetes mellitus with diabetic chronic kidney disease: Secondary | ICD-10-CM | POA: Diagnosis not present

## 2016-07-10 DIAGNOSIS — Z471 Aftercare following joint replacement surgery: Secondary | ICD-10-CM | POA: Diagnosis not present

## 2016-07-10 DIAGNOSIS — M1711 Unilateral primary osteoarthritis, right knee: Secondary | ICD-10-CM | POA: Diagnosis not present

## 2016-07-10 DIAGNOSIS — G2581 Restless legs syndrome: Secondary | ICD-10-CM | POA: Diagnosis not present

## 2016-07-10 DIAGNOSIS — G4730 Sleep apnea, unspecified: Secondary | ICD-10-CM | POA: Diagnosis not present

## 2016-07-12 DIAGNOSIS — E1122 Type 2 diabetes mellitus with diabetic chronic kidney disease: Secondary | ICD-10-CM | POA: Diagnosis not present

## 2016-07-12 DIAGNOSIS — Z471 Aftercare following joint replacement surgery: Secondary | ICD-10-CM | POA: Diagnosis not present

## 2016-07-12 DIAGNOSIS — M1711 Unilateral primary osteoarthritis, right knee: Secondary | ICD-10-CM | POA: Diagnosis not present

## 2016-07-12 DIAGNOSIS — J40 Bronchitis, not specified as acute or chronic: Secondary | ICD-10-CM | POA: Diagnosis not present

## 2016-07-12 DIAGNOSIS — G2581 Restless legs syndrome: Secondary | ICD-10-CM | POA: Diagnosis not present

## 2016-07-12 DIAGNOSIS — G4730 Sleep apnea, unspecified: Secondary | ICD-10-CM | POA: Diagnosis not present

## 2016-07-15 DIAGNOSIS — G8929 Other chronic pain: Secondary | ICD-10-CM | POA: Diagnosis not present

## 2016-07-15 DIAGNOSIS — M25662 Stiffness of left knee, not elsewhere classified: Secondary | ICD-10-CM | POA: Diagnosis not present

## 2016-07-15 DIAGNOSIS — Z96652 Presence of left artificial knee joint: Secondary | ICD-10-CM | POA: Diagnosis not present

## 2016-07-15 DIAGNOSIS — Z471 Aftercare following joint replacement surgery: Secondary | ICD-10-CM | POA: Diagnosis not present

## 2016-07-15 DIAGNOSIS — M25562 Pain in left knee: Secondary | ICD-10-CM | POA: Diagnosis not present

## 2016-07-15 DIAGNOSIS — R2689 Other abnormalities of gait and mobility: Secondary | ICD-10-CM | POA: Diagnosis not present

## 2016-07-16 DIAGNOSIS — M1712 Unilateral primary osteoarthritis, left knee: Secondary | ICD-10-CM | POA: Diagnosis not present

## 2016-07-17 DIAGNOSIS — Z96652 Presence of left artificial knee joint: Secondary | ICD-10-CM | POA: Diagnosis not present

## 2016-07-17 DIAGNOSIS — Z471 Aftercare following joint replacement surgery: Secondary | ICD-10-CM | POA: Diagnosis not present

## 2016-07-17 DIAGNOSIS — R2689 Other abnormalities of gait and mobility: Secondary | ICD-10-CM | POA: Diagnosis not present

## 2016-07-17 DIAGNOSIS — M25562 Pain in left knee: Secondary | ICD-10-CM | POA: Diagnosis not present

## 2016-07-17 DIAGNOSIS — M25662 Stiffness of left knee, not elsewhere classified: Secondary | ICD-10-CM | POA: Diagnosis not present

## 2016-07-17 DIAGNOSIS — G8929 Other chronic pain: Secondary | ICD-10-CM | POA: Diagnosis not present

## 2016-07-18 DIAGNOSIS — D518 Other vitamin B12 deficiency anemias: Secondary | ICD-10-CM | POA: Diagnosis not present

## 2016-07-22 DIAGNOSIS — M25662 Stiffness of left knee, not elsewhere classified: Secondary | ICD-10-CM | POA: Diagnosis not present

## 2016-07-22 DIAGNOSIS — M25562 Pain in left knee: Secondary | ICD-10-CM | POA: Diagnosis not present

## 2016-07-22 DIAGNOSIS — R2689 Other abnormalities of gait and mobility: Secondary | ICD-10-CM | POA: Diagnosis not present

## 2016-07-22 DIAGNOSIS — Z471 Aftercare following joint replacement surgery: Secondary | ICD-10-CM | POA: Diagnosis not present

## 2016-07-22 DIAGNOSIS — G8929 Other chronic pain: Secondary | ICD-10-CM | POA: Diagnosis not present

## 2016-07-22 DIAGNOSIS — Z96652 Presence of left artificial knee joint: Secondary | ICD-10-CM | POA: Diagnosis not present

## 2016-07-24 DIAGNOSIS — G8929 Other chronic pain: Secondary | ICD-10-CM | POA: Diagnosis not present

## 2016-07-24 DIAGNOSIS — M25662 Stiffness of left knee, not elsewhere classified: Secondary | ICD-10-CM | POA: Diagnosis not present

## 2016-07-24 DIAGNOSIS — Z96652 Presence of left artificial knee joint: Secondary | ICD-10-CM | POA: Diagnosis not present

## 2016-07-24 DIAGNOSIS — M25562 Pain in left knee: Secondary | ICD-10-CM | POA: Diagnosis not present

## 2016-07-24 DIAGNOSIS — R2689 Other abnormalities of gait and mobility: Secondary | ICD-10-CM | POA: Diagnosis not present

## 2016-07-24 DIAGNOSIS — Z471 Aftercare following joint replacement surgery: Secondary | ICD-10-CM | POA: Diagnosis not present

## 2016-07-31 DIAGNOSIS — R2689 Other abnormalities of gait and mobility: Secondary | ICD-10-CM | POA: Diagnosis not present

## 2016-07-31 DIAGNOSIS — Z96652 Presence of left artificial knee joint: Secondary | ICD-10-CM | POA: Diagnosis not present

## 2016-07-31 DIAGNOSIS — Z471 Aftercare following joint replacement surgery: Secondary | ICD-10-CM | POA: Diagnosis not present

## 2016-07-31 DIAGNOSIS — G8929 Other chronic pain: Secondary | ICD-10-CM | POA: Diagnosis not present

## 2016-07-31 DIAGNOSIS — M25562 Pain in left knee: Secondary | ICD-10-CM | POA: Diagnosis not present

## 2016-07-31 DIAGNOSIS — M25662 Stiffness of left knee, not elsewhere classified: Secondary | ICD-10-CM | POA: Diagnosis not present

## 2016-08-02 DIAGNOSIS — G8929 Other chronic pain: Secondary | ICD-10-CM | POA: Diagnosis not present

## 2016-08-02 DIAGNOSIS — M25662 Stiffness of left knee, not elsewhere classified: Secondary | ICD-10-CM | POA: Diagnosis not present

## 2016-08-02 DIAGNOSIS — Z96652 Presence of left artificial knee joint: Secondary | ICD-10-CM | POA: Diagnosis not present

## 2016-08-02 DIAGNOSIS — Z471 Aftercare following joint replacement surgery: Secondary | ICD-10-CM | POA: Diagnosis not present

## 2016-08-02 DIAGNOSIS — M25562 Pain in left knee: Secondary | ICD-10-CM | POA: Diagnosis not present

## 2016-08-02 DIAGNOSIS — R2689 Other abnormalities of gait and mobility: Secondary | ICD-10-CM | POA: Diagnosis not present

## 2017-04-28 ENCOUNTER — Ambulatory Visit (INDEPENDENT_AMBULATORY_CARE_PROVIDER_SITE_OTHER): Payer: Medicare Other | Admitting: Podiatry

## 2017-04-28 ENCOUNTER — Encounter (INDEPENDENT_AMBULATORY_CARE_PROVIDER_SITE_OTHER): Payer: Self-pay

## 2017-04-28 ENCOUNTER — Ambulatory Visit (INDEPENDENT_AMBULATORY_CARE_PROVIDER_SITE_OTHER): Payer: Medicare Other

## 2017-04-28 ENCOUNTER — Encounter: Payer: Self-pay | Admitting: Podiatry

## 2017-04-28 VITALS — BP 153/89 | HR 77

## 2017-04-28 DIAGNOSIS — M722 Plantar fascial fibromatosis: Secondary | ICD-10-CM

## 2017-04-28 NOTE — Patient Instructions (Signed)

## 2017-05-02 DIAGNOSIS — M722 Plantar fascial fibromatosis: Secondary | ICD-10-CM | POA: Diagnosis not present

## 2017-05-02 MED ORDER — DEXAMETHASONE SODIUM PHOSPHATE 120 MG/30ML IJ SOLN
8.0000 mg | Freq: Once | INTRAMUSCULAR | Status: AC
  Administered 2017-05-02: 8 mg via INTRA_ARTICULAR

## 2017-05-02 MED ORDER — TRIAMCINOLONE ACETONIDE 10 MG/ML IJ SUSP: 5.0000 mg | Freq: Once | INTRAMUSCULAR | Status: DC

## 2017-05-02 MED ORDER — DEXAMETHASONE SODIUM PHOSPHATE 120 MG/30ML IJ SOLN: 4.0000 mg | Freq: Once | INTRAMUSCULAR | Status: DC

## 2017-05-02 MED ORDER — TRIAMCINOLONE ACETONIDE 10 MG/ML IJ SUSP
10.0000 mg | Freq: Once | INTRAMUSCULAR | Status: AC
  Administered 2017-05-02: 10 mg

## 2017-05-02 NOTE — Progress Notes (Addendum)
  Subjective:  Patient ID: Sally Garrison, female    DOB: May 09, 1966,  MRN: [id number] HPI Chief Complaint  Patient presents with  . Foot Pain    had EPF several years ago has started having sharp pain in the heels agiain feels like the spurs are going to come through the skin R>L    51 year old female presents today for bilateral heel pain. States that she had a history of discomfort plantar fasciectomy bilaterally over 10 years ago. Reports pain to bilateral heels that she believes to be from her heel spurs. Believes that the heel spurs should've been removed at time of surgery and since they weren't she has residual pain.States she cannot walk states that she must wear cushions in her shoes. States that he feels that she constantly walks on sharp rocks.   Allergies  Allergen Reactions  . Ibuprofen Other (See Comments)    BLEEDING   . Moxifloxacin Hives and Shortness Of Breath  . Penicillins Anaphylaxis    Has patient had a PCN reaction causing immediate rash, facial/tongue/throat swelling, SOB or lightheadedness with hypotension:Yes Has patient had a PCN reaction causing severe rash involving mucus membranes or skin necrosis:unsure Has patient had a PCN reaction that required hospitalization:unsure Has patient had a PCN reaction occurring within the last 10 years:No Childhood reaction If all of the above answers are "NO", then may proceed with Cephalosporin use.   . Quinolones Hives and Shortness Of Breath  . Avelox [Moxifloxacin Hcl In Nacl] Hives  . Tape Dermatitis and Other (See Comments)    Blisters especially with STERI-STRIPS  . Terramycin [Oxytetracycline] Itching   Review of Systems negative except as noted in the history of present illness Objective:   Vitals:   04/28/17 1404  BP: (!) 153/89  Pulse: 77   General AA&O x3. Normal mood and affect.  Vascular Dorsalis pedis and posterior tibial pulses  present 2+ bilaterally  Capillary refill normal to all digits. Pedal  hair growth normal.  Neurologic Epicritic sensation grossly present bilaterally.  Dermatologic No open lesions. Interspaces clear of maceration. Nails well groomed and normal in appearance.  Orthopedic: MMT 5/5 in dorsiflexion, plantarflexion, inversion, and eversion bilaterally. Tender to palpation at the calcaneal tuber bilaterally. No pain with calcaneal squeeze bilaterally. Ankle ROM diminished range of motion bilaterally. Silfverskiold Test: positive bilaterally. Unable to palpate medial band of the plantar fascia bilaterally    Radiographs: Taken and reviewed. No acute fractures. No evidence of calcaneal stress fracture. Moderate plantar calcaneal heel spurs noted   Assessment & Plan:  Patient was evaluated and treated and all questions answered.  Plantar Fasciitis, bilaterally - XR reviewed as above.  - Educated and continued icing and stretching - Injections delivered as below - Discussed with patient that the heel spurs and cells are 80 rather good coverage source discussed injection therapy to alleviate inflammation. Patient was discussed resection of the heel spurs however advised her that this is not guaranteed to rid her of her pain. Will consider MRI if injections fail to improve her pain. Will consider resection of heel spur as option of last resort.  Procedure: Injection Tendon/Ligament Location: Bilateral plantar fascia at the glabrous junction; medial approach. Skin Prep: Alcohol. Injectate: 1 cc 0.5% marcaine plain, 1 cc dexamethasone phosphate, 0.5 cc kenalog 10. Disposition: Patient tolerated procedure well. Injection site dressed with a band-aid.  Return in about 6 weeks (around 06/09/2017).

## 2017-06-09 ENCOUNTER — Encounter: Payer: Self-pay | Admitting: Podiatry

## 2017-06-09 ENCOUNTER — Ambulatory Visit (INDEPENDENT_AMBULATORY_CARE_PROVIDER_SITE_OTHER): Payer: Medicare Other | Admitting: Podiatry

## 2017-06-09 ENCOUNTER — Encounter (INDEPENDENT_AMBULATORY_CARE_PROVIDER_SITE_OTHER): Payer: Self-pay

## 2017-06-09 DIAGNOSIS — M722 Plantar fascial fibromatosis: Secondary | ICD-10-CM

## 2017-06-09 NOTE — Progress Notes (Signed)
  Subjective:  Patient ID: Sally Garrison, female    DOB: [birth year],  MRN: [id number]  Chief Complaint  Patient presents with  . Plantar Fasciitis    Bilateral feet tingle, still having pain in both heels   51 y.o. female returns for the above complaint.  States she is still having pain in both heels.  States her injections last about a week.  States there is a point on each heel that she believes is from the bone spur that causes her pain  Objective:  There were no vitals filed for this visit. General AA&O x3. Normal mood and affect.  Vascular Pedal pulses palpable.  Neurologic Epicritic sensation grossly intact.  Dermatologic No open lesions. Skin normal texture and turgor.  Orthopedic: Pain to palpation of the plantar calcaneal tuber bilateral    Assessment & Plan:  Patient was evaluated and treated and all questions answered.  Plantar fasciitis bilateral -Repeat injections both heels -Discussed if pain does not improve we will consider MRI -If pain persist would consider plantar fasciotomy with spur resection  Return in about 3 weeks (around 06/30/2017) for Plantar fasciitis.

## 2017-06-30 ENCOUNTER — Ambulatory Visit (INDEPENDENT_AMBULATORY_CARE_PROVIDER_SITE_OTHER): Payer: Medicare Other | Admitting: Podiatry

## 2017-06-30 DIAGNOSIS — M722 Plantar fascial fibromatosis: Secondary | ICD-10-CM | POA: Diagnosis not present

## 2017-07-07 NOTE — Progress Notes (Signed)
  Subjective:  Patient ID: Sally Garrison, female    DOB: Mar 05, 1966,  MRN: [id number]  Chief Complaint  Patient presents with  . Plantar Fasciitis    fu bilateral no change still feels like I am standing on rocks    51 y.o. female returns for the above complaint.  States that she feels like she is standing on rocks  Objective:  There were no vitals filed for this visit. General AA&O x3. Normal mood and affect.  Vascular Pedal pulses palpable.  Neurologic Epicritic sensation grossly intact.  Dermatologic No open lesions. Skin normal texture and turgor.  Orthopedic: Pain to palpation plantar calcaneus bilat.   Assessment & Plan:  Patient was evaluated and treated and all questions answered.  Plantar Fasciitis -Educated on shoes and orthotic inserts -Discussed that since her pain is due to fat pad atrophy surgery to resect the spur may not reduce her pain and could worsen it. -Patient to trial OTC orthotics and f/u if needed.

## 2017-08-11 ENCOUNTER — Ambulatory Visit: Payer: Medicare Other | Admitting: Podiatry

## 2018-10-31 IMAGING — CR DG CHEST 2V
2 series · 2 of 2 positions shown · non-contrast
Comparison: February 15, 2014

CLINICAL DATA: Preoperative evaluation for total knee replacement.
Cough.

EXAM:
CHEST  2 VIEW

[w chest pa]
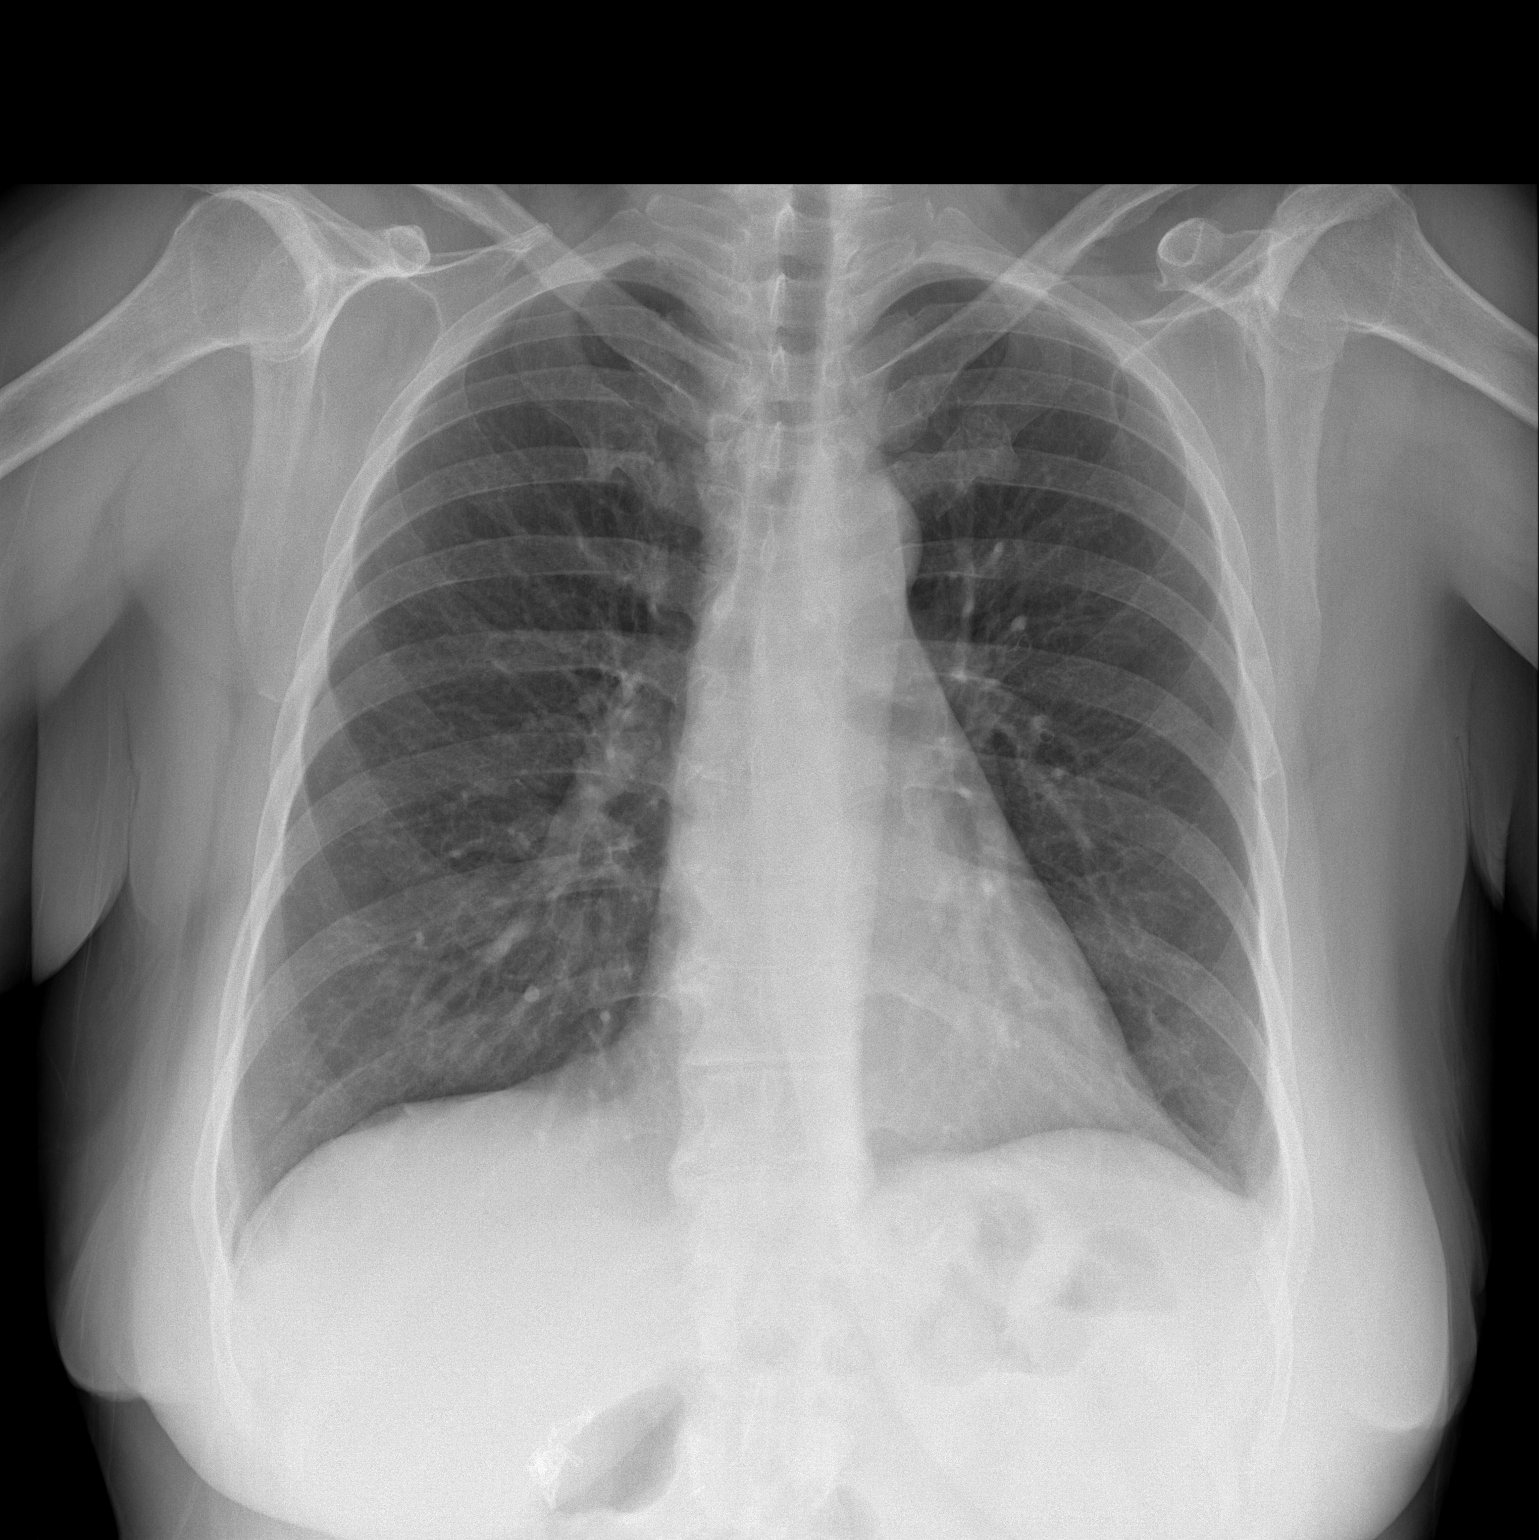

[w chest lat]
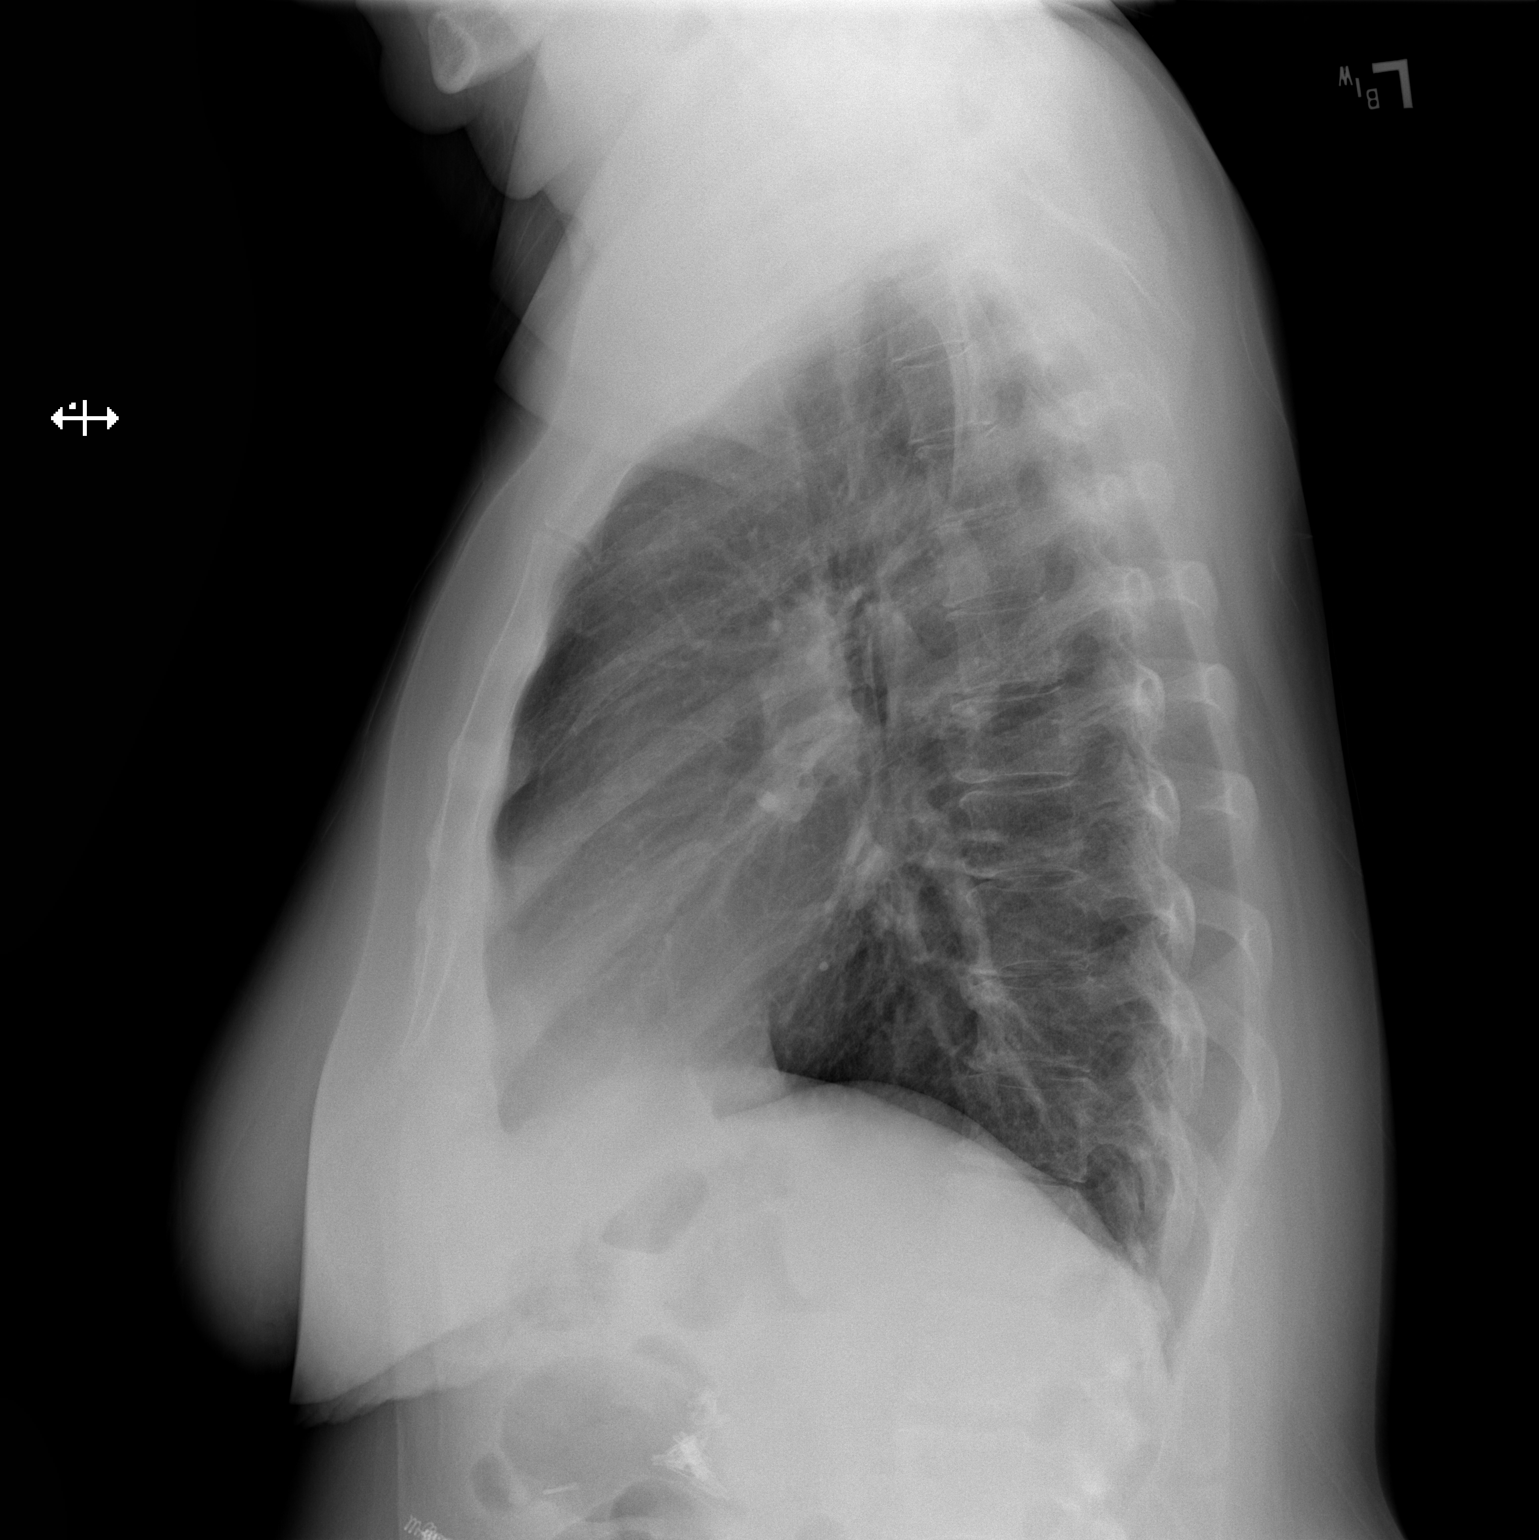

[2 of 2 positions shown; findings below may reference images not displayed]

FINDINGS: Lungs are clear. Heart size and pulmonary vascularity are normal. No
adenopathy. No bone lesions. There are surgical clips in the right
upper abdomen.
IMPRESSION: No edema or consolidation.

## 2019-08-17 DIAGNOSIS — F4310 Post-traumatic stress disorder, unspecified: Secondary | ICD-10-CM | POA: Diagnosis not present

## 2019-08-17 DIAGNOSIS — F445 Conversion disorder with seizures or convulsions: Secondary | ICD-10-CM | POA: Diagnosis not present

## 2019-08-17 DIAGNOSIS — F411 Generalized anxiety disorder: Secondary | ICD-10-CM | POA: Diagnosis not present

## 2019-08-17 DIAGNOSIS — F4321 Adjustment disorder with depressed mood: Secondary | ICD-10-CM | POA: Diagnosis not present

## 2019-08-17 DIAGNOSIS — Z9884 Bariatric surgery status: Secondary | ICD-10-CM | POA: Diagnosis not present

## 2019-08-24 DIAGNOSIS — G894 Chronic pain syndrome: Secondary | ICD-10-CM | POA: Diagnosis not present

## 2019-08-24 DIAGNOSIS — F321 Major depressive disorder, single episode, moderate: Secondary | ICD-10-CM | POA: Diagnosis not present

## 2019-08-24 DIAGNOSIS — R635 Abnormal weight gain: Secondary | ICD-10-CM | POA: Diagnosis not present

## 2019-08-24 DIAGNOSIS — E785 Hyperlipidemia, unspecified: Secondary | ICD-10-CM | POA: Diagnosis not present

## 2019-08-24 DIAGNOSIS — R569 Unspecified convulsions: Secondary | ICD-10-CM | POA: Diagnosis not present

## 2019-08-24 DIAGNOSIS — Z6839 Body mass index (BMI) 39.0-39.9, adult: Secondary | ICD-10-CM | POA: Diagnosis not present

## 2019-08-24 DIAGNOSIS — E119 Type 2 diabetes mellitus without complications: Secondary | ICD-10-CM | POA: Diagnosis not present

## 2019-08-24 DIAGNOSIS — F419 Anxiety disorder, unspecified: Secondary | ICD-10-CM | POA: Diagnosis not present

## 2019-08-24 DIAGNOSIS — D518 Other vitamin B12 deficiency anemias: Secondary | ICD-10-CM | POA: Diagnosis not present

## 2019-08-25 DIAGNOSIS — M180 Bilateral primary osteoarthritis of first carpometacarpal joints: Secondary | ICD-10-CM | POA: Diagnosis not present

## 2019-08-30 DIAGNOSIS — M180 Bilateral primary osteoarthritis of first carpometacarpal joints: Secondary | ICD-10-CM | POA: Diagnosis not present

## 2019-08-30 DIAGNOSIS — M25541 Pain in joints of right hand: Secondary | ICD-10-CM | POA: Diagnosis not present

## 2019-09-17 DIAGNOSIS — Z1231 Encounter for screening mammogram for malignant neoplasm of breast: Secondary | ICD-10-CM | POA: Diagnosis not present

## 2019-09-30 DIAGNOSIS — Z6839 Body mass index (BMI) 39.0-39.9, adult: Secondary | ICD-10-CM | POA: Diagnosis not present

## 2019-09-30 DIAGNOSIS — F321 Major depressive disorder, single episode, moderate: Secondary | ICD-10-CM | POA: Diagnosis not present

## 2019-09-30 DIAGNOSIS — E669 Obesity, unspecified: Secondary | ICD-10-CM | POA: Diagnosis not present

## 2019-09-30 DIAGNOSIS — D518 Other vitamin B12 deficiency anemias: Secondary | ICD-10-CM | POA: Diagnosis not present

## 2019-09-30 DIAGNOSIS — R635 Abnormal weight gain: Secondary | ICD-10-CM | POA: Diagnosis not present

## 2019-11-01 DIAGNOSIS — F321 Major depressive disorder, single episode, moderate: Secondary | ICD-10-CM | POA: Diagnosis not present

## 2019-11-01 DIAGNOSIS — Z6839 Body mass index (BMI) 39.0-39.9, adult: Secondary | ICD-10-CM | POA: Diagnosis not present

## 2019-11-01 DIAGNOSIS — D518 Other vitamin B12 deficiency anemias: Secondary | ICD-10-CM | POA: Diagnosis not present

## 2019-11-01 DIAGNOSIS — R635 Abnormal weight gain: Secondary | ICD-10-CM | POA: Diagnosis not present

## 2019-12-02 DIAGNOSIS — D518 Other vitamin B12 deficiency anemias: Secondary | ICD-10-CM | POA: Diagnosis not present

## 2019-12-02 DIAGNOSIS — Z1211 Encounter for screening for malignant neoplasm of colon: Secondary | ICD-10-CM | POA: Diagnosis not present

## 2019-12-02 DIAGNOSIS — R87629 Unspecified abnormal cytological findings in specimens from vagina: Secondary | ICD-10-CM | POA: Diagnosis not present

## 2019-12-02 DIAGNOSIS — Z0000 Encounter for general adult medical examination without abnormal findings: Secondary | ICD-10-CM | POA: Diagnosis not present

## 2019-12-02 DIAGNOSIS — Z6839 Body mass index (BMI) 39.0-39.9, adult: Secondary | ICD-10-CM | POA: Diagnosis not present

## 2019-12-23 DIAGNOSIS — K219 Gastro-esophageal reflux disease without esophagitis: Secondary | ICD-10-CM | POA: Diagnosis not present

## 2019-12-23 DIAGNOSIS — G4733 Obstructive sleep apnea (adult) (pediatric): Secondary | ICD-10-CM | POA: Diagnosis not present

## 2019-12-23 DIAGNOSIS — M1990 Unspecified osteoarthritis, unspecified site: Secondary | ICD-10-CM | POA: Diagnosis not present

## 2019-12-23 DIAGNOSIS — M79644 Pain in right finger(s): Secondary | ICD-10-CM | POA: Diagnosis not present

## 2019-12-23 DIAGNOSIS — F419 Anxiety disorder, unspecified: Secondary | ICD-10-CM | POA: Diagnosis not present

## 2019-12-23 DIAGNOSIS — S61302A Unspecified open wound of right middle finger with damage to nail, initial encounter: Secondary | ICD-10-CM | POA: Diagnosis not present

## 2019-12-23 DIAGNOSIS — E119 Type 2 diabetes mellitus without complications: Secondary | ICD-10-CM | POA: Diagnosis not present

## 2019-12-23 DIAGNOSIS — Z23 Encounter for immunization: Secondary | ICD-10-CM | POA: Diagnosis not present

## 2019-12-23 DIAGNOSIS — F329 Major depressive disorder, single episode, unspecified: Secondary | ICD-10-CM | POA: Diagnosis not present

## 2019-12-23 DIAGNOSIS — F4310 Post-traumatic stress disorder, unspecified: Secondary | ICD-10-CM | POA: Diagnosis not present

## 2019-12-23 DIAGNOSIS — G8911 Acute pain due to trauma: Secondary | ICD-10-CM | POA: Diagnosis not present

## 2020-01-06 DIAGNOSIS — D518 Other vitamin B12 deficiency anemias: Secondary | ICD-10-CM | POA: Diagnosis not present

## 2020-01-12 DIAGNOSIS — Z09 Encounter for follow-up examination after completed treatment for conditions other than malignant neoplasm: Secondary | ICD-10-CM | POA: Diagnosis not present

## 2020-01-12 DIAGNOSIS — Z90710 Acquired absence of both cervix and uterus: Secondary | ICD-10-CM | POA: Diagnosis not present

## 2020-01-12 DIAGNOSIS — Z9079 Acquired absence of other genital organ(s): Secondary | ICD-10-CM | POA: Diagnosis not present

## 2020-01-12 DIAGNOSIS — Z9884 Bariatric surgery status: Secondary | ICD-10-CM | POA: Diagnosis not present

## 2020-01-12 DIAGNOSIS — Z90722 Acquired absence of ovaries, bilateral: Secondary | ICD-10-CM | POA: Diagnosis not present

## 2020-01-12 DIAGNOSIS — Z87410 Personal history of cervical dysplasia: Secondary | ICD-10-CM | POA: Diagnosis not present

## 2020-01-12 DIAGNOSIS — R87620 Atypical squamous cells of undetermined significance on cytologic smear of vagina (ASC-US): Secondary | ICD-10-CM | POA: Diagnosis not present

## 2020-03-07 DIAGNOSIS — D518 Other vitamin B12 deficiency anemias: Secondary | ICD-10-CM | POA: Diagnosis not present

## 2020-03-07 DIAGNOSIS — R569 Unspecified convulsions: Secondary | ICD-10-CM | POA: Diagnosis not present

## 2020-03-07 DIAGNOSIS — E119 Type 2 diabetes mellitus without complications: Secondary | ICD-10-CM | POA: Diagnosis not present

## 2020-03-07 DIAGNOSIS — J309 Allergic rhinitis, unspecified: Secondary | ICD-10-CM | POA: Diagnosis not present

## 2020-03-07 DIAGNOSIS — Z6841 Body Mass Index (BMI) 40.0 and over, adult: Secondary | ICD-10-CM | POA: Diagnosis not present

## 2020-03-07 DIAGNOSIS — G894 Chronic pain syndrome: Secondary | ICD-10-CM | POA: Diagnosis not present

## 2020-03-07 DIAGNOSIS — M549 Dorsalgia, unspecified: Secondary | ICD-10-CM | POA: Diagnosis not present

## 2020-03-07 DIAGNOSIS — E785 Hyperlipidemia, unspecified: Secondary | ICD-10-CM | POA: Diagnosis not present

## 2020-03-07 DIAGNOSIS — G8929 Other chronic pain: Secondary | ICD-10-CM | POA: Diagnosis not present

## 2020-03-20 DIAGNOSIS — M545 Low back pain: Secondary | ICD-10-CM | POA: Diagnosis not present

## 2020-04-14 DIAGNOSIS — M4316 Spondylolisthesis, lumbar region: Secondary | ICD-10-CM | POA: Diagnosis not present

## 2020-04-14 DIAGNOSIS — M5116 Intervertebral disc disorders with radiculopathy, lumbar region: Secondary | ICD-10-CM | POA: Diagnosis not present

## 2020-04-14 DIAGNOSIS — M4726 Other spondylosis with radiculopathy, lumbar region: Secondary | ICD-10-CM | POA: Diagnosis not present

## 2020-04-14 DIAGNOSIS — M5117 Intervertebral disc disorders with radiculopathy, lumbosacral region: Secondary | ICD-10-CM | POA: Diagnosis not present

## 2020-04-14 DIAGNOSIS — M5416 Radiculopathy, lumbar region: Secondary | ICD-10-CM | POA: Diagnosis not present

## 2020-04-21 DIAGNOSIS — M545 Low back pain: Secondary | ICD-10-CM | POA: Diagnosis not present

## 2020-05-01 DIAGNOSIS — M47816 Spondylosis without myelopathy or radiculopathy, lumbar region: Secondary | ICD-10-CM | POA: Diagnosis not present

## 2020-05-01 DIAGNOSIS — F17210 Nicotine dependence, cigarettes, uncomplicated: Secondary | ICD-10-CM | POA: Diagnosis not present

## 2020-05-01 DIAGNOSIS — Z6841 Body Mass Index (BMI) 40.0 and over, adult: Secondary | ICD-10-CM | POA: Diagnosis not present

## 2020-05-09 DIAGNOSIS — G4730 Sleep apnea, unspecified: Secondary | ICD-10-CM | POA: Diagnosis not present

## 2020-05-09 DIAGNOSIS — Z1211 Encounter for screening for malignant neoplasm of colon: Secondary | ICD-10-CM | POA: Diagnosis not present

## 2020-05-09 DIAGNOSIS — D123 Benign neoplasm of transverse colon: Secondary | ICD-10-CM | POA: Diagnosis not present

## 2020-05-09 DIAGNOSIS — K635 Polyp of colon: Secondary | ICD-10-CM | POA: Diagnosis not present

## 2020-05-09 DIAGNOSIS — R569 Unspecified convulsions: Secondary | ICD-10-CM | POA: Diagnosis not present

## 2020-05-09 DIAGNOSIS — K5730 Diverticulosis of large intestine without perforation or abscess without bleeding: Secondary | ICD-10-CM | POA: Diagnosis not present

## 2020-05-09 DIAGNOSIS — Z800 Family history of malignant neoplasm of digestive organs: Secondary | ICD-10-CM | POA: Diagnosis not present

## 2020-05-09 DIAGNOSIS — R7303 Prediabetes: Secondary | ICD-10-CM | POA: Diagnosis not present

## 2020-05-09 DIAGNOSIS — K648 Other hemorrhoids: Secondary | ICD-10-CM | POA: Diagnosis not present

## 2020-05-09 DIAGNOSIS — Z86010 Personal history of colonic polyps: Secondary | ICD-10-CM | POA: Diagnosis not present

## 2020-05-09 DIAGNOSIS — D125 Benign neoplasm of sigmoid colon: Secondary | ICD-10-CM | POA: Diagnosis not present

## 2020-05-17 DIAGNOSIS — E785 Hyperlipidemia, unspecified: Secondary | ICD-10-CM | POA: Diagnosis not present

## 2020-05-17 DIAGNOSIS — E669 Obesity, unspecified: Secondary | ICD-10-CM | POA: Diagnosis not present

## 2020-05-17 DIAGNOSIS — Z9181 History of falling: Secondary | ICD-10-CM | POA: Diagnosis not present

## 2020-05-17 DIAGNOSIS — Z0000 Encounter for general adult medical examination without abnormal findings: Secondary | ICD-10-CM | POA: Diagnosis not present

## 2020-05-17 DIAGNOSIS — Z1331 Encounter for screening for depression: Secondary | ICD-10-CM | POA: Diagnosis not present

## 2020-05-25 DIAGNOSIS — M47816 Spondylosis without myelopathy or radiculopathy, lumbar region: Secondary | ICD-10-CM | POA: Diagnosis not present

## 2020-06-09 DIAGNOSIS — Z6839 Body mass index (BMI) 39.0-39.9, adult: Secondary | ICD-10-CM | POA: Diagnosis not present

## 2020-06-09 DIAGNOSIS — F321 Major depressive disorder, single episode, moderate: Secondary | ICD-10-CM | POA: Diagnosis not present

## 2020-06-09 DIAGNOSIS — E785 Hyperlipidemia, unspecified: Secondary | ICD-10-CM | POA: Diagnosis not present

## 2020-06-09 DIAGNOSIS — D518 Other vitamin B12 deficiency anemias: Secondary | ICD-10-CM | POA: Diagnosis not present

## 2020-06-09 DIAGNOSIS — F419 Anxiety disorder, unspecified: Secondary | ICD-10-CM | POA: Diagnosis not present

## 2020-06-09 DIAGNOSIS — M545 Low back pain, unspecified: Secondary | ICD-10-CM | POA: Diagnosis not present

## 2020-06-09 DIAGNOSIS — Z23 Encounter for immunization: Secondary | ICD-10-CM | POA: Diagnosis not present

## 2020-06-09 DIAGNOSIS — E119 Type 2 diabetes mellitus without complications: Secondary | ICD-10-CM | POA: Diagnosis not present

## 2020-06-09 DIAGNOSIS — E669 Obesity, unspecified: Secondary | ICD-10-CM | POA: Diagnosis not present

## 2020-06-16 DIAGNOSIS — F17210 Nicotine dependence, cigarettes, uncomplicated: Secondary | ICD-10-CM | POA: Diagnosis not present

## 2020-06-16 DIAGNOSIS — M47816 Spondylosis without myelopathy or radiculopathy, lumbar region: Secondary | ICD-10-CM | POA: Diagnosis not present

## 2020-06-16 DIAGNOSIS — Z716 Tobacco abuse counseling: Secondary | ICD-10-CM | POA: Diagnosis not present

## 2020-07-05 DIAGNOSIS — M47816 Spondylosis without myelopathy or radiculopathy, lumbar region: Secondary | ICD-10-CM | POA: Diagnosis not present

## 2020-07-20 DIAGNOSIS — M47816 Spondylosis without myelopathy or radiculopathy, lumbar region: Secondary | ICD-10-CM | POA: Diagnosis not present

## 2021-02-08 ENCOUNTER — Other Ambulatory Visit: Payer: Self-pay | Admitting: Orthopedic Surgery

## 2021-04-03 ENCOUNTER — Encounter (HOSPITAL_BASED_OUTPATIENT_CLINIC_OR_DEPARTMENT_OTHER): Payer: Self-pay | Admitting: Orthopedic Surgery

## 2021-04-11 NOTE — Anesthesia Preprocedure Evaluation (Addendum)
Anesthesia Evaluation  Patient identified by MRN, date of birth, ID band Patient awake    Reviewed: Allergy & Precautions, NPO status , Patient's Chart, lab work & pertinent test results  Airway Mallampati: II  TM Distance: >3 FB Neck ROM: Full    Dental no notable dental hx. (+) Edentulous Lower, Edentulous Upper   Pulmonary Current SmokerPatient did not abstain from smoking.,    Pulmonary exam normal breath sounds clear to auscultation       Cardiovascular Exercise Tolerance: Good Normal cardiovascular exam Rhythm:Regular Rate:Normal     Neuro/Psych  Headaches,    GI/Hepatic GERD  ,  Endo/Other    Renal/GU      Musculoskeletal  (+) Arthritis ,   Abdominal (+) + obese,   Peds  Hematology   Anesthesia Other Findings ALL : ibuprofen, Moxifloxacin, PCN  Reproductive/Obstetrics                            Anesthesia Physical Anesthesia Plan  ASA: 2  Anesthesia Plan: Regional   Post-op Pain Management:    Induction:   PONV Risk Score and Plan: Treatment may vary due to age or medical condition, Ondansetron, Propofol infusion and Midazolam  Airway Management Planned: Natural Airway and Simple Face Mask  Additional Equipment: None  Intra-op Plan:   Post-operative Plan:   Informed Consent:     Dental advisory given  Plan Discussed with:   Anesthesia Plan Comments: (Mac plus R supraclavicular block)       Anesthesia Quick Evaluation

## 2021-04-12 ENCOUNTER — Ambulatory Visit (HOSPITAL_BASED_OUTPATIENT_CLINIC_OR_DEPARTMENT_OTHER): Payer: Medicare HMO | Admitting: Anesthesiology

## 2021-04-12 ENCOUNTER — Ambulatory Visit (HOSPITAL_BASED_OUTPATIENT_CLINIC_OR_DEPARTMENT_OTHER)
Admission: RE | Admit: 2021-04-12 | Discharge: 2021-04-12 | Disposition: A | Discharge status: Home / Self Care | Payer: Medicare HMO | Attending: Orthopedic Surgery | Admitting: Orthopedic Surgery

## 2021-04-12 ENCOUNTER — Encounter (HOSPITAL_BASED_OUTPATIENT_CLINIC_OR_DEPARTMENT_OTHER)
Admission: RE | Disposition: A | Discharge status: Home / Self Care | Payer: Self-pay | Source: Home / Self Care | Attending: Orthopedic Surgery

## 2021-04-12 ENCOUNTER — Encounter (HOSPITAL_BASED_OUTPATIENT_CLINIC_OR_DEPARTMENT_OTHER): Payer: Self-pay | Admitting: Orthopedic Surgery

## 2021-04-12 ENCOUNTER — Other Ambulatory Visit: Payer: Self-pay

## 2021-04-12 DIAGNOSIS — Z96653 Presence of artificial knee joint, bilateral: Secondary | ICD-10-CM | POA: Insufficient documentation

## 2021-04-12 DIAGNOSIS — Z91048 Other nonmedicinal substance allergy status: Secondary | ICD-10-CM | POA: Insufficient documentation

## 2021-04-12 DIAGNOSIS — F17210 Nicotine dependence, cigarettes, uncomplicated: Secondary | ICD-10-CM | POA: Insufficient documentation

## 2021-04-12 DIAGNOSIS — Z880 Allergy status to penicillin: Secondary | ICD-10-CM | POA: Insufficient documentation

## 2021-04-12 DIAGNOSIS — Z9884 Bariatric surgery status: Secondary | ICD-10-CM | POA: Diagnosis not present

## 2021-04-12 DIAGNOSIS — Z881 Allergy status to other antibiotic agents status: Secondary | ICD-10-CM | POA: Insufficient documentation

## 2021-04-12 DIAGNOSIS — Z79899 Other long term (current) drug therapy: Secondary | ICD-10-CM | POA: Diagnosis not present

## 2021-04-12 DIAGNOSIS — Z886 Allergy status to analgesic agent status: Secondary | ICD-10-CM | POA: Diagnosis not present

## 2021-04-12 DIAGNOSIS — M1811 Unilateral primary osteoarthritis of first carpometacarpal joint, right hand: Secondary | ICD-10-CM | POA: Insufficient documentation

## 2021-04-12 HISTORY — PX: CARPOMETACARPEL SUSPENSION PLASTY: SHX5005

## 2021-04-12 SURGERY — CARPOMETACARPEL (CMC) SUSPENSION PLASTY
Anesthesia: Regional | Site: Hand | Laterality: Right

## 2021-04-12 MED ORDER — MIDAZOLAM HCL 2 MG/2ML IJ SOLN
2.0000 mg | Freq: Once | INTRAMUSCULAR | Status: AC
  Administered 2021-04-12: 2 mg via INTRAVENOUS

## 2021-04-12 MED ORDER — PROPOFOL 500 MG/50ML IV EMUL
INTRAVENOUS | Status: DC | PRN
  Administered 2021-04-12: 100 ug/kg/min via INTRAVENOUS

## 2021-04-12 MED ORDER — MIDAZOLAM HCL 2 MG/2ML IJ SOLN
INTRAMUSCULAR | Status: AC
  Filled 2021-04-12: qty 2

## 2021-04-12 MED ORDER — ONDANSETRON HCL 4 MG/2ML IJ SOLN
INTRAMUSCULAR | Status: DC | PRN
  Administered 2021-04-12: 4 mg via INTRAVENOUS

## 2021-04-12 MED ORDER — OXYCODONE-ACETAMINOPHEN 5-325 MG PO TABS: ORAL_TABLET | ORAL | 0 refills | Status: AC

## 2021-04-12 MED ORDER — FENTANYL CITRATE (PF) 100 MCG/2ML IJ SOLN
100.0000 ug | Freq: Once | INTRAMUSCULAR | Status: AC
  Administered 2021-04-12: 100 ug via INTRAVENOUS

## 2021-04-12 MED ORDER — PROPOFOL 500 MG/50ML IV EMUL
INTRAVENOUS | Status: AC
  Filled 2021-04-12: qty 50

## 2021-04-12 MED ORDER — 0.9 % SODIUM CHLORIDE (POUR BTL) OPTIME
TOPICAL | Status: DC | PRN
  Administered 2021-04-12: 100 mL

## 2021-04-12 MED ORDER — FENTANYL CITRATE (PF) 100 MCG/2ML IJ SOLN
INTRAMUSCULAR | Status: AC
  Filled 2021-04-12: qty 2

## 2021-04-12 MED ORDER — CLINDAMYCIN PHOSPHATE 900 MG/50ML IV SOLN
INTRAVENOUS | Status: AC
  Filled 2021-04-12: qty 50

## 2021-04-12 MED ORDER — BUPIVACAINE-EPINEPHRINE (PF) 0.5% -1:200000 IJ SOLN
INTRAMUSCULAR | Status: DC | PRN
  Administered 2021-04-12: 30 mL via PERINEURAL

## 2021-04-12 MED ORDER — CLINDAMYCIN PHOSPHATE 900 MG/50ML IV SOLN
900.0000 mg | INTRAVENOUS | Status: AC
  Administered 2021-04-12: 900 mg via INTRAVENOUS

## 2021-04-12 MED ORDER — ONDANSETRON HCL 4 MG/2ML IJ SOLN
INTRAMUSCULAR | Status: AC
  Filled 2021-04-12: qty 2

## 2021-04-12 MED ORDER — FENTANYL CITRATE (PF) 100 MCG/2ML IJ SOLN
INTRAMUSCULAR | Status: DC | PRN
  Administered 2021-04-12 (×2): 25 ug via INTRAVENOUS

## 2021-04-12 MED ORDER — LACTATED RINGERS IV SOLN: INTRAVENOUS | Status: DC

## 2021-04-12 SURGICAL SUPPLY — 61 items
APL PRP STRL LF DISP 70% ISPRP (MISCELLANEOUS) ×1
BIT DRILL PASSING CMC 1/4 FLEX (BIT) IMPLANT
BLADE MINI RND TIP GREEN BEAV (BLADE) ×2 IMPLANT
BLADE SURG 15 STRL LF DISP TIS (BLADE) ×2 IMPLANT
BLADE SURG 15 STRL SS (BLADE) ×4
BNDG CMPR 9X4 STRL LF SNTH (GAUZE/BANDAGES/DRESSINGS) ×1
BNDG ELASTIC 2X5.8 VLCR STR LF (GAUZE/BANDAGES/DRESSINGS) IMPLANT
BNDG ELASTIC 3X5.8 VLCR STR LF (GAUZE/BANDAGES/DRESSINGS) IMPLANT
BNDG ESMARK 4X9 LF (GAUZE/BANDAGES/DRESSINGS) ×2 IMPLANT
BNDG GAUZE ELAST 4 BULKY (GAUZE/BANDAGES/DRESSINGS) ×2 IMPLANT
BUTTON ALL-SUT W/BACKSTOP (Orthopedic Implant) ×1 IMPLANT
CHLORAPREP W/TINT 26 (MISCELLANEOUS) ×2 IMPLANT
CORD BIPOLAR FORCEPS 12FT (ELECTRODE) ×2 IMPLANT
COVER BACK TABLE 60X90IN (DRAPES) ×2 IMPLANT
COVER MAYO STAND STRL (DRAPES) ×2 IMPLANT
CUFF TOURN SGL QUICK 18X4 (TOURNIQUET CUFF) ×1 IMPLANT
DRAPE EXTREMITY T 121X128X90 (DISPOSABLE) ×2 IMPLANT
DRAPE OEC MINIVIEW 54X84 (DRAPES) ×2 IMPLANT
DRAPE SURG 17X23 STRL (DRAPES) ×2 IMPLANT
DRILL PASSING CMC 1/4 FLEX (BIT) ×2
GAUZE SPONGE 4X4 12PLY STRL (GAUZE/BANDAGES/DRESSINGS) ×2 IMPLANT
GAUZE XEROFORM 1X8 LF (GAUZE/BANDAGES/DRESSINGS) ×2 IMPLANT
GLOVE SRG 8 PF TXTR STRL LF DI (GLOVE) ×1 IMPLANT
GLOVE SURG ENC MOIS LTX SZ7.5 (GLOVE) ×3 IMPLANT
GLOVE SURG ORTHO LTX SZ8 (GLOVE) ×1 IMPLANT
GLOVE SURG UNDER POLY LF SZ8 (GLOVE) ×2
GLOVE SURG UNDER POLY LF SZ8.5 (GLOVE) ×1 IMPLANT
GOWN STRL REUS W/ TWL LRG LVL3 (GOWN DISPOSABLE) ×1 IMPLANT
GOWN STRL REUS W/TWL LRG LVL3 (GOWN DISPOSABLE) ×6
GOWN STRL REUS W/TWL XL LVL3 (GOWN DISPOSABLE) ×2 IMPLANT
NDL HYPO 25X1 1.5 SAFETY (NEEDLE) IMPLANT
NDL KEITH (NEEDLE) IMPLANT
NDL SAFETY ECLIPSE 18X1.5 (NEEDLE) IMPLANT
NEEDLE HYPO 18GX1.5 SHARP (NEEDLE)
NEEDLE HYPO 25X1 1.5 SAFETY (NEEDLE) ×2 IMPLANT
NEEDLE KEITH (NEEDLE) IMPLANT
PACK BASIN DAY SURGERY FS (CUSTOM PROCEDURE TRAY) ×2 IMPLANT
PAD CAST 3X4 CTTN HI CHSV (CAST SUPPLIES) ×1 IMPLANT
PAD CAST 4YDX4 CTTN HI CHSV (CAST SUPPLIES) IMPLANT
PADDING CAST ABS 4INX4YD NS (CAST SUPPLIES) ×1
PADDING CAST ABS COTTON 4X4 ST (CAST SUPPLIES) ×1 IMPLANT
PADDING CAST COTTON 3X4 STRL (CAST SUPPLIES) ×2
PADDING CAST COTTON 4X4 STRL (CAST SUPPLIES)
SLEEVE SCD COMPRESS KNEE MED (STOCKING) ×1 IMPLANT
SPLINT FAST PLASTER 5X30 (CAST SUPPLIES)
SPLINT PLASTER CAST FAST 5X30 (CAST SUPPLIES) IMPLANT
SPLINT PLASTER CAST XFAST 4X15 (CAST SUPPLIES) IMPLANT
SPLINT PLASTER XTRA FAST SET 4 (CAST SUPPLIES)
STOCKINETTE 4X48 STRL (DRAPES) ×2 IMPLANT
SUT ETHIBOND 3-0 V-5 (SUTURE) ×1 IMPLANT
SUT ETHILON 4 0 PS 2 18 (SUTURE) ×2 IMPLANT
SUT FIBERWIRE 2-0 18 17.9 3/8 (SUTURE) ×2
SUT MERSILENE 2.0 SH NDLE (SUTURE) IMPLANT
SUT MERSILENE 4 0 P 3 (SUTURE) IMPLANT
SUT VICRYL 0 SH 27 (SUTURE) IMPLANT
SUT VICRYL 4-0 PS2 18IN ABS (SUTURE) ×2 IMPLANT
SUTURE FIBERWR 2-0 18 17.9 3/8 (SUTURE) ×1 IMPLANT
SYR BULB EAR ULCER 3OZ GRN STR (SYRINGE) ×2 IMPLANT
SYR CONTROL 10ML LL (SYRINGE) IMPLANT
TOWEL GREEN STERILE FF (TOWEL DISPOSABLE) ×4 IMPLANT
UNDERPAD 30X36 HEAVY ABSORB (UNDERPADS AND DIAPERS) ×2 IMPLANT

## 2021-04-12 NOTE — Op Note (Signed)
I assisted Surgeon(s) and Role:    * Leanora Cover, MD - Primary    Daryll Brod, MD - Assisting on the Procedure(s): CARPOMETACARPEL [facility]) SUSPENSION PLASTY AND TRAPEZIECTOMY on 04/12/2021.  I provided assistance on this case as follows: setup, approach, identification and protection of the radial artery, identification and removal of the trapezium, harvest of the FCR tendon for transfer,placement of the Microlink suture, transfer of the tendon, closure of he incisions and application of the dressings and splints.  Electronically signed by: Daryll Brod, MD Date: 04/12/2021 Time: 10:10 AM

## 2021-04-12 NOTE — Transfer of Care (Signed)
Immediate Anesthesia Transfer of Care Note  Patient: Sally Garrison  Procedure(s) Performed: CARPOMETACARPEL ([place]) SUSPENSION PLASTY AND TRAPEZIECTOMY (Right: Hand)  Patient Location: PACU  Anesthesia Type:MAC and Regional  Level of Consciousness: awake, alert  and oriented  Airway & Oxygen Therapy: Patient Spontanous Breathing and Patient connected to face mask oxygen  Post-op Assessment: Report given to RN and Post -op Vital signs reviewed and stable  Post vital signs: Reviewed and stable  Last Vitals:  Vitals Value Taken Time  BP    Temp    Pulse    Resp    SpO2      Last Pain:  Vitals:   04/12/21 0708  TempSrc: Oral  PainSc: 6       Patients Stated Pain Goal: 3 (48/88/91 6945)  Complications: No notable events documented.

## 2021-04-12 NOTE — Progress Notes (Signed)
Assisted Dr. Houser with right, ultrasound guided, supraclavicular block. Side rails up, monitors on throughout procedure. See vital signs in flow sheet. Tolerated Procedure well. 

## 2021-04-12 NOTE — Discharge Instructions (Addendum)

## 2021-04-12 NOTE — Anesthesia Procedure Notes (Addendum)
Anesthesia Regional Block: Supraclavicular block   Pre-Anesthetic Checklist: , timeout performed,  Correct Patient, Correct Site, Correct Laterality,  Correct Procedure, Correct Position, site marked,  Risks and benefits discussed,  Surgical consent,  Pre-op evaluation,  At surgeon's request and post-op pain management  Laterality: Right and Upper  Prep: chloraprep       Needles:  Injection technique: Single-shot  Needle Type: Echogenic Needle     Needle Length: 5cm  Needle Gauge: 21     Additional Needles:   Procedures:,,,, ultrasound used (permanent image in chart),,    Narrative:  Start time: 04/12/2021 7:45 AM End time: 04/12/2021 7:53 AM Injection made incrementally with aspirations every 5 mL.  Performed by: Personally  Anesthesiologist: Barnet Glasgow, MD

## 2021-04-12 NOTE — Anesthesia Postprocedure Evaluation (Signed)
Anesthesia Post Note  Patient: Jocelyn L Jaquez  Procedure(s) Performed: CARPOMETACARPEL ([place]) SUSPENSION PLASTY AND TRAPEZIECTOMY (Right: Hand)     Patient location during evaluation: PACU Anesthesia Type: Regional Level of consciousness: awake and alert Pain management: pain level controlled Vital Signs Assessment: post-procedure vital signs reviewed and stable Respiratory status: spontaneous breathing, nonlabored ventilation, respiratory function stable and patient connected to nasal cannula oxygen Cardiovascular status: stable and blood pressure returned to baseline Postop Assessment: no apparent nausea or vomiting Anesthetic complications: no   No notable events documented.  Last Vitals:  Vitals:   04/12/21 1045 04/12/21 1103  BP: 113/71 104/76  Pulse: 69 72  Resp: 18 16  Temp:  36.9 C  SpO2: 94% 94%    Last Pain:  Vitals:   04/12/21 1103  TempSrc:   PainSc: 0-No pain                 Barnet Glasgow

## 2021-04-12 NOTE — H&P (Signed)
Sally Garrison is an 55 y.o. female.   Chief Complaint: arthritis HPI: 55 yo female with right thumb cmc arthritis.  Continued pain despite non operative measures.  She wishes to proceed with trapeziectomy and suspensionplasty.  Allergies:  Allergies  Allergen Reactions   Ibuprofen Other (See Comments)    BLEEDING    Moxifloxacin Hives and Shortness Of Breath   Penicillins Anaphylaxis    Has patient had a PCN reaction causing immediate rash, facial/tongue/throat swelling, SOB or lightheadedness with hypotension:Yes Has patient had a PCN reaction causing severe rash involving mucus membranes or skin necrosis:unsure Has patient had a PCN reaction that required hospitalization:unsure Has patient had a PCN reaction occurring within the last 10 years:No Childhood reaction If all of the above answers are "NO", then may proceed with Cephalosporin use.    Quinolones Hives and Shortness Of Breath   Avelox [Moxifloxacin Hcl In Nacl] Hives   Tape Dermatitis and Other (See Comments)    Blisters especially with STERI-STRIPS   Terramycin [Oxytetracycline] Itching    Past Medical History:  Diagnosis Date   Anemia    on monthly period since June   Anxiety    Arthritis    Asthma    Bronchitis    Cancer ([place])    cervical Cancer   Chronic kidney disease    kidney stones   Depression    GERD (gastroesophageal reflux disease)    Headache(784.0)    migraines   PONV (postoperative nausea and vomiting)    PTSD (post-traumatic stress disorder)    Shortness of breath    Sleep apnea    not using CPAP   Thyroid nodule     Past Surgical History:  Procedure Laterality Date   APPENDECTOMY     bone spur     BREAST SURGERY Right    lumpectomy x2   CESAREAN SECTION     CHOLECYSTECTOMY     GASTRIC BYPASS  2007   HERNIA REPAIR     abdominal x3   KNEE ARTHROSCOPY Right    OVARIAN CYST SURGERY     PLANTAR FASCIA RELEASE Bilateral    SEPTOPLASTY     SHOULDER OPEN ROTATOR CUFF REPAIR Right     TONSILLECTOMY     adenoidectomy   TOTAL KNEE ARTHROPLASTY Right 02/28/2014   Procedure: TOTAL KNEE ARTHROPLASTY;  Surgeon: Kerin Salen, MD;  Location: [facility];  Service: Orthopedics;  Laterality: Right;   TOTAL KNEE ARTHROPLASTY Left 07/01/2016   Procedure: TOTAL KNEE ARTHROPLASTY;  Surgeon: Frederik Pear, MD;  Location: [facility];  Service: Orthopedics;  Laterality: Left;    Family History: History reviewed. No pertinent family history.  Social History:   reports that she has been smoking cigarettes. She has a 5.00 pack-year smoking history. She has never used smokeless tobacco. She reports current drug use. Frequency: 2.00 times per week. Drug: Marijuana. She reports that she does not drink alcohol.  Medications: Medications Prior to Admission  Medication Sig Dispense Refill   acetaminophen (TYLENOL) 325 MG tablet Take 325-650 mg by mouth every 6 (six) hours as needed for mild pain, fever or headache.      amitriptyline (ELAVIL) 25 MG tablet      buPROPion (WELLBUTRIN XL) 150 MG 24 hr tablet      FLUoxetine (PROZAC) 20 MG capsule Take 20 mg by mouth daily.     fluticasone (FLONASE) 50 MCG/ACT nasal spray Place 1-2 sprays into both nostrils daily as needed for allergies.     levocetirizine (XYZAL)  5 MG tablet Take 5 mg by mouth daily.     methocarbamol (ROBAXIN) 500 MG tablet Take 1 tablet (500 mg total) by mouth 2 (two) times daily with a meal. 60 tablet 0   pantoprazole (PROTONIX) 40 MG tablet Take 40 mg by mouth 2 (two) times daily.     rOPINIRole (REQUIP) 1 MG tablet Take 1 mg by mouth at bedtime.     SUMAtriptan (IMITREX) 50 MG tablet Take 50-100 mg by mouth every 2 (two) hours as needed for migraine. May repeat in 2 hours if headache persists or recurs.     topiramate (TOPAMAX) 50 MG tablet Take 75 mg by mouth 2 (two) times daily.     cyanocobalamin (,VITAMIN B-12,) 1000 MCG/ML injection Inject 1,000 mcg into the muscle every 30 (thirty) days.      No results found for this or any  previous visit (from the past 48 hour(s)).  No results found.   A comprehensive review of systems was negative.  Blood pressure 109/66, pulse 67, temperature 97.8 F (36.6 C), temperature source Oral, resp. rate 17, height '5\' 1"'$  (1.549 m), weight 81.6 kg, last menstrual period 09/27/2015, SpO2 99 %.  General appearance: alert, cooperative, and appears stated age Head: Normocephalic, without obvious abnormality, atraumatic Neck: supple, symmetrical, trachea midline Cardio: regular rate and rhythm Resp: clear to auscultation bilaterally Extremities: Intact sensation and capillary refill all digits.  +epl/fpl/io.  No wounds.  Pulses: 2+ and symmetric Skin: Skin color, texture, turgor normal. No rashes or lesions Neurologic: Grossly normal Incision/Wound: none  Assessment/Plan Right thumb cmc arthritis.  Non operative and operative treatment options have been discussed with the patient and patient wishes to proceed with operative treatment. Risks, benefits, and alternatives of surgery have been discussed and the patient agrees with the plan of care.   Leanora Cover 04/12/2021, 8:41 AM

## 2021-04-12 NOTE — Op Note (Addendum)
NAME: Sally Garrison MEDICAL RECORD NO: [id number] DATE OF BIRTH: [birth year] FACILITY: [facility] LOCATION: [facility] PHYSICIAN: Tennis Must, MD   OPERATIVE REPORT   DATE OF PROCEDURE: 04/12/21    PREOPERATIVE DIAGNOSIS: Right thumb CMC osteoarthritis   POSTOPERATIVE DIAGNOSIS: Right thumb CMC osteoarthritis   PROCEDURE: 1.  Right thumb trapeziectomy 2.  Right thumb suspensioplasty   SURGEON:  Leanora Cover, M.D.   ASSISTANT: Daryll Brod, MD   ANESTHESIA:  Regional with sedation   INTRAVENOUS FLUIDS:  Per anesthesia flow sheet.   ESTIMATED BLOOD LOSS:  Minimal.   COMPLICATIONS:  None.   SPECIMENS:  none   TOURNIQUET TIME:    Total Tourniquet Time Documented: Upper Arm (Right) - 47 minutes Total: Upper Arm (Right) - 47 minutes    DISPOSITION:  Stable to PACU.   INDICATIONS: 55 year old female with osteoarthritis of the right thumb CMC joint.  She is tried nonoperative measures without lasting relief.  She wishes to proceed with right thumb trapeziectomy and suspensioplasty.  Risks, benefits and alternatives of surgery were discussed including the risks of blood loss, infection, damage to nerves, vessels, tendons, ligaments, bone for surgery, need for additional surgery, complications with wound healing, continued pain, stiffness.  She voiced understanding of these risks and elected to proceed.  OPERATIVE COURSE:  After being identified preoperatively by myself,  the patient and I agreed on the procedure and site of the procedure.  The surgical site was marked.  Surgical consent had been signed. She was given IV antibiotics as preoperative antibiotic prophylaxis. She was transferred to the operating room and placed on the operating table in supine position with the Right upper extremity on an arm board.  Sedation was induced by the anesthesiologist. A regional block had been performed by anesthesia in preoperative holding.    Right upper extremity was prepped and  draped in normal sterile orthopedic fashion.  A surgical pause was performed between the surgeons, anesthesia, and operating room staff and all were in agreement as to the patient, procedure, and site of procedure.  Tourniquet at the proximal aspect of the extremity was inflated to 250 mmHg after exsanguination of the arm with an Esmarch bandage.  Incision was made at the dorsum of the thumb over the [facility] joint.  This was carried in subcutaneous tissues by spreading technique.  Bipolar electrocautery was used to obtain hemostasis.  The deep branch of the radial artery was identified and protected throughout the case.  The capsule was sharply incised.  The trapezium was identified.  Was carefully freed of soft tissue attachments using the freer and knife.  The rondure was used to remove the trapezium.  There were osteophytes between the index finger and thumb metacarpals and at the volar aspect.  These were all removed.  The FCR tendon was identified.  Was placed under traction.  One half of the tendon was harvested from proximally creating a distally based tendon graft.  The microlink suture anchor was used.  A guidepin was placed from the base of the APL tendon insertion at its dorsal aspect.  This went through the thumb metacarpal and through the index finger metacarpal.  The C-arm was used in AP lateral oblique projections to ensure appropriate position of the guidepin which was the case.  The trapezium was also noted to have been adequately removed.  Incision was made at the dorsum of the hand to retrieve the guidepin.  The anchor was placed with a guidepin.  It was  sutured with the thumb placed under traction and a hemostat between the thumb and index finger metacarpal to create a space.  The FCR tendon was then weaved through the APL tendon and sewn to the APL tendon and then back onto itself with 3-0 Ethibond suture to create a suspension plasty.  C-arm was used in AP lateral and oblique projections to ensure  appropriate position of the anchors and suspension of the thumb metacarpal which was the case.  The wound was copiously irrigated with sterile saline.  The dorsal wound was closed with 4-0 nylon in a horizontal mattress fashion.  The capsule was repaired back over the [facility] joints best possible using the 4-0 Vicryl suture.  An inverted interrupted 4 oh Vicryl suture was placed in subcutaneous tissues and skin was closed with 4-0 nylon in a horizontal mattress fashion.  The wounds were dressed with sterile Xeroform 4 x 4's and wrapped with a Kerlix bandage.  Thumb spica splint was placed and wrapped with Kerlix and Ace bandage.  The tourniquet was deflated at 47 minutes.  Fingertips were pink with brisk capillary refill after deflation of tourniquet.  The operative  drapes were broken down.  The patient was awoken from anesthesia safely.  She was transferred back to the stretcher and taken to PACU in stable condition.  I will see her back in the office in 1 week for postoperative followup.  I will give her a prescription for Percocet 5/325 1-2 tabs PO q6 hours prn pain, dispense # 25.   Leanora Cover, MD Electronically signed, 04/12/21

## 2021-04-13 ENCOUNTER — Encounter (HOSPITAL_BASED_OUTPATIENT_CLINIC_OR_DEPARTMENT_OTHER): Payer: Self-pay | Admitting: Orthopedic Surgery

## 2021-06-11 DIAGNOSIS — M25541 Pain in joints of right hand: Secondary | ICD-10-CM | POA: Diagnosis not present

## 2021-06-11 DIAGNOSIS — M25641 Stiffness of right hand, not elsewhere classified: Secondary | ICD-10-CM | POA: Diagnosis not present

## 2021-06-11 DIAGNOSIS — M180 Bilateral primary osteoarthritis of first carpometacarpal joints: Secondary | ICD-10-CM | POA: Diagnosis not present

## 2021-06-18 DIAGNOSIS — D519 Vitamin B12 deficiency anemia, unspecified: Secondary | ICD-10-CM | POA: Diagnosis not present

## 2021-06-18 DIAGNOSIS — Z23 Encounter for immunization: Secondary | ICD-10-CM | POA: Diagnosis not present

## 2021-06-25 DIAGNOSIS — M180 Bilateral primary osteoarthritis of first carpometacarpal joints: Secondary | ICD-10-CM | POA: Diagnosis not present

## 2021-06-25 DIAGNOSIS — M25541 Pain in joints of right hand: Secondary | ICD-10-CM | POA: Diagnosis not present

## 2021-06-25 DIAGNOSIS — M25641 Stiffness of right hand, not elsewhere classified: Secondary | ICD-10-CM | POA: Diagnosis not present

## 2021-07-06 DIAGNOSIS — M25641 Stiffness of right hand, not elsewhere classified: Secondary | ICD-10-CM | POA: Diagnosis not present

## 2021-07-06 DIAGNOSIS — M180 Bilateral primary osteoarthritis of first carpometacarpal joints: Secondary | ICD-10-CM | POA: Diagnosis not present

## 2021-07-06 DIAGNOSIS — M25631 Stiffness of right wrist, not elsewhere classified: Secondary | ICD-10-CM | POA: Diagnosis not present

## 2021-07-13 DIAGNOSIS — E119 Type 2 diabetes mellitus without complications: Secondary | ICD-10-CM | POA: Diagnosis not present

## 2021-07-18 DIAGNOSIS — D518 Other vitamin B12 deficiency anemias: Secondary | ICD-10-CM | POA: Diagnosis not present

## 2021-08-17 DIAGNOSIS — M1990 Unspecified osteoarthritis, unspecified site: Secondary | ICD-10-CM | POA: Diagnosis not present

## 2021-08-17 DIAGNOSIS — J019 Acute sinusitis, unspecified: Secondary | ICD-10-CM | POA: Diagnosis not present

## 2021-08-17 DIAGNOSIS — D518 Other vitamin B12 deficiency anemias: Secondary | ICD-10-CM | POA: Diagnosis not present

## 2021-08-17 DIAGNOSIS — E1165 Type 2 diabetes mellitus with hyperglycemia: Secondary | ICD-10-CM | POA: Diagnosis not present

## 2021-08-17 DIAGNOSIS — E785 Hyperlipidemia, unspecified: Secondary | ICD-10-CM | POA: Diagnosis not present

## 2021-08-17 DIAGNOSIS — K219 Gastro-esophageal reflux disease without esophagitis: Secondary | ICD-10-CM | POA: Diagnosis not present

## 2021-08-17 DIAGNOSIS — R519 Headache, unspecified: Secondary | ICD-10-CM | POA: Diagnosis not present

## 2021-08-17 DIAGNOSIS — M545 Low back pain, unspecified: Secondary | ICD-10-CM | POA: Diagnosis not present

## 2021-09-17 DIAGNOSIS — D518 Other vitamin B12 deficiency anemias: Secondary | ICD-10-CM | POA: Diagnosis not present

## 2021-10-15 DIAGNOSIS — D518 Other vitamin B12 deficiency anemias: Secondary | ICD-10-CM | POA: Diagnosis not present

## 2021-11-16 DIAGNOSIS — D518 Other vitamin B12 deficiency anemias: Secondary | ICD-10-CM | POA: Diagnosis not present

## 2021-12-10 DIAGNOSIS — E119 Type 2 diabetes mellitus without complications: Secondary | ICD-10-CM | POA: Diagnosis not present

## 2021-12-25 DIAGNOSIS — M1990 Unspecified osteoarthritis, unspecified site: Secondary | ICD-10-CM | POA: Diagnosis not present

## 2021-12-25 DIAGNOSIS — G8929 Other chronic pain: Secondary | ICD-10-CM | POA: Diagnosis not present

## 2021-12-25 DIAGNOSIS — R519 Headache, unspecified: Secondary | ICD-10-CM | POA: Diagnosis not present

## 2021-12-25 DIAGNOSIS — K219 Gastro-esophageal reflux disease without esophagitis: Secondary | ICD-10-CM | POA: Diagnosis not present

## 2021-12-25 DIAGNOSIS — M545 Low back pain, unspecified: Secondary | ICD-10-CM | POA: Diagnosis not present

## 2021-12-25 DIAGNOSIS — D518 Other vitamin B12 deficiency anemias: Secondary | ICD-10-CM | POA: Diagnosis not present

## 2021-12-25 DIAGNOSIS — E1165 Type 2 diabetes mellitus with hyperglycemia: Secondary | ICD-10-CM | POA: Diagnosis not present

## 2021-12-25 DIAGNOSIS — R252 Cramp and spasm: Secondary | ICD-10-CM | POA: Diagnosis not present

## 2021-12-25 DIAGNOSIS — E785 Hyperlipidemia, unspecified: Secondary | ICD-10-CM | POA: Diagnosis not present

## 2022-01-08 DIAGNOSIS — E119 Type 2 diabetes mellitus without complications: Secondary | ICD-10-CM | POA: Diagnosis not present

## 2022-01-29 DIAGNOSIS — D518 Other vitamin B12 deficiency anemias: Secondary | ICD-10-CM | POA: Diagnosis not present

## 2022-01-29 DIAGNOSIS — Z1231 Encounter for screening mammogram for malignant neoplasm of breast: Secondary | ICD-10-CM | POA: Diagnosis not present

## 2022-03-05 DIAGNOSIS — D518 Other vitamin B12 deficiency anemias: Secondary | ICD-10-CM | POA: Diagnosis not present

## 2022-04-29 DIAGNOSIS — D518 Other vitamin B12 deficiency anemias: Secondary | ICD-10-CM | POA: Diagnosis not present

## 2022-04-29 DIAGNOSIS — R221 Localized swelling, mass and lump, neck: Secondary | ICD-10-CM | POA: Diagnosis not present

## 2022-04-29 DIAGNOSIS — E1165 Type 2 diabetes mellitus with hyperglycemia: Secondary | ICD-10-CM | POA: Diagnosis not present

## 2022-04-29 DIAGNOSIS — Z23 Encounter for immunization: Secondary | ICD-10-CM | POA: Diagnosis not present

## 2022-04-29 DIAGNOSIS — E785 Hyperlipidemia, unspecified: Secondary | ICD-10-CM | POA: Diagnosis not present

## 2022-04-29 DIAGNOSIS — M1990 Unspecified osteoarthritis, unspecified site: Secondary | ICD-10-CM | POA: Diagnosis not present

## 2022-04-29 DIAGNOSIS — R519 Headache, unspecified: Secondary | ICD-10-CM | POA: Diagnosis not present

## 2022-05-30 DIAGNOSIS — D518 Other vitamin B12 deficiency anemias: Secondary | ICD-10-CM | POA: Diagnosis not present

## 2022-06-24 DIAGNOSIS — J019 Acute sinusitis, unspecified: Secondary | ICD-10-CM | POA: Diagnosis not present

## 2022-06-24 DIAGNOSIS — H1031 Unspecified acute conjunctivitis, right eye: Secondary | ICD-10-CM | POA: Diagnosis not present

## 2022-07-04 DIAGNOSIS — D518 Other vitamin B12 deficiency anemias: Secondary | ICD-10-CM | POA: Diagnosis not present

## 2022-07-25 DIAGNOSIS — K6389 Other specified diseases of intestine: Secondary | ICD-10-CM | POA: Diagnosis not present

## 2022-07-25 DIAGNOSIS — G2581 Restless legs syndrome: Secondary | ICD-10-CM | POA: Diagnosis not present

## 2022-07-25 DIAGNOSIS — Z9089 Acquired absence of other organs: Secondary | ICD-10-CM | POA: Diagnosis not present

## 2022-07-25 DIAGNOSIS — K219 Gastro-esophageal reflux disease without esophagitis: Secondary | ICD-10-CM | POA: Diagnosis not present

## 2022-07-25 DIAGNOSIS — Z9049 Acquired absence of other specified parts of digestive tract: Secondary | ICD-10-CM | POA: Diagnosis not present

## 2022-07-25 DIAGNOSIS — G4730 Sleep apnea, unspecified: Secondary | ICD-10-CM | POA: Diagnosis not present

## 2022-07-25 DIAGNOSIS — N136 Pyonephrosis: Secondary | ICD-10-CM | POA: Diagnosis not present

## 2022-07-25 DIAGNOSIS — Z466 Encounter for fitting and adjustment of urinary device: Secondary | ICD-10-CM | POA: Diagnosis not present

## 2022-07-25 DIAGNOSIS — G43009 Migraine without aura, not intractable, without status migrainosus: Secondary | ICD-10-CM | POA: Diagnosis not present

## 2022-07-25 DIAGNOSIS — A419 Sepsis, unspecified organism: Secondary | ICD-10-CM | POA: Diagnosis not present

## 2022-07-25 DIAGNOSIS — Z1152 Encounter for screening for COVID-19: Secondary | ICD-10-CM | POA: Diagnosis not present

## 2022-07-25 DIAGNOSIS — J9601 Acute respiratory failure with hypoxia: Secondary | ICD-10-CM | POA: Diagnosis not present

## 2022-07-25 DIAGNOSIS — Z9884 Bariatric surgery status: Secondary | ICD-10-CM | POA: Diagnosis not present

## 2022-07-25 DIAGNOSIS — K279 Peptic ulcer, site unspecified, unspecified as acute or chronic, without hemorrhage or perforation: Secondary | ICD-10-CM | POA: Diagnosis not present

## 2022-07-25 DIAGNOSIS — N132 Hydronephrosis with renal and ureteral calculous obstruction: Secondary | ICD-10-CM | POA: Diagnosis not present

## 2022-07-25 DIAGNOSIS — M5430 Sciatica, unspecified side: Secondary | ICD-10-CM | POA: Diagnosis not present

## 2022-07-25 DIAGNOSIS — F17210 Nicotine dependence, cigarettes, uncomplicated: Secondary | ICD-10-CM | POA: Diagnosis not present

## 2022-07-25 DIAGNOSIS — N390 Urinary tract infection, site not specified: Secondary | ICD-10-CM | POA: Diagnosis not present

## 2022-07-25 DIAGNOSIS — Z96653 Presence of artificial knee joint, bilateral: Secondary | ICD-10-CM | POA: Diagnosis not present

## 2022-07-25 DIAGNOSIS — N12 Tubulo-interstitial nephritis, not specified as acute or chronic: Secondary | ICD-10-CM | POA: Diagnosis not present

## 2022-07-25 DIAGNOSIS — K760 Fatty (change of) liver, not elsewhere classified: Secondary | ICD-10-CM | POA: Diagnosis not present

## 2022-07-25 DIAGNOSIS — Z886 Allergy status to analgesic agent status: Secondary | ICD-10-CM | POA: Diagnosis not present

## 2022-07-25 DIAGNOSIS — N200 Calculus of kidney: Secondary | ICD-10-CM | POA: Diagnosis not present

## 2022-07-25 DIAGNOSIS — Z888 Allergy status to other drugs, medicaments and biological substances status: Secondary | ICD-10-CM | POA: Diagnosis not present

## 2022-07-25 DIAGNOSIS — N201 Calculus of ureter: Secondary | ICD-10-CM | POA: Diagnosis not present

## 2022-07-25 DIAGNOSIS — Z8669 Personal history of other diseases of the nervous system and sense organs: Secondary | ICD-10-CM | POA: Diagnosis not present

## 2022-07-25 DIAGNOSIS — Z91040 Latex allergy status: Secondary | ICD-10-CM | POA: Diagnosis not present

## 2022-07-25 DIAGNOSIS — N3289 Other specified disorders of bladder: Secondary | ICD-10-CM | POA: Diagnosis not present

## 2022-07-25 DIAGNOSIS — N10 Acute tubulo-interstitial nephritis: Secondary | ICD-10-CM | POA: Diagnosis not present

## 2022-07-25 DIAGNOSIS — Z880 Allergy status to penicillin: Secondary | ICD-10-CM | POA: Diagnosis not present

## 2022-07-25 DIAGNOSIS — Z79899 Other long term (current) drug therapy: Secondary | ICD-10-CM | POA: Diagnosis not present

## 2022-07-25 DIAGNOSIS — F32A Depression, unspecified: Secondary | ICD-10-CM | POA: Diagnosis not present

## 2022-07-26 DIAGNOSIS — Z9884 Bariatric surgery status: Secondary | ICD-10-CM | POA: Diagnosis not present

## 2022-07-26 DIAGNOSIS — N201 Calculus of ureter: Secondary | ICD-10-CM | POA: Diagnosis not present

## 2022-07-26 DIAGNOSIS — K219 Gastro-esophageal reflux disease without esophagitis: Secondary | ICD-10-CM | POA: Diagnosis not present

## 2022-07-26 DIAGNOSIS — Z96653 Presence of artificial knee joint, bilateral: Secondary | ICD-10-CM | POA: Diagnosis not present

## 2022-07-26 DIAGNOSIS — G4730 Sleep apnea, unspecified: Secondary | ICD-10-CM | POA: Diagnosis not present

## 2022-08-12 DIAGNOSIS — N201 Calculus of ureter: Secondary | ICD-10-CM | POA: Diagnosis not present

## 2022-08-12 DIAGNOSIS — N200 Calculus of kidney: Secondary | ICD-10-CM | POA: Diagnosis not present

## 2022-08-15 DIAGNOSIS — N10 Acute tubulo-interstitial nephritis: Secondary | ICD-10-CM | POA: Diagnosis not present

## 2022-08-15 DIAGNOSIS — E1165 Type 2 diabetes mellitus with hyperglycemia: Secondary | ICD-10-CM | POA: Diagnosis not present

## 2022-08-15 DIAGNOSIS — N132 Hydronephrosis with renal and ureteral calculous obstruction: Secondary | ICD-10-CM | POA: Diagnosis not present

## 2022-08-15 DIAGNOSIS — K219 Gastro-esophageal reflux disease without esophagitis: Secondary | ICD-10-CM | POA: Diagnosis not present

## 2022-08-15 DIAGNOSIS — Z79899 Other long term (current) drug therapy: Secondary | ICD-10-CM | POA: Diagnosis not present

## 2022-08-15 DIAGNOSIS — D518 Other vitamin B12 deficiency anemias: Secondary | ICD-10-CM | POA: Diagnosis not present

## 2022-08-16 DIAGNOSIS — K219 Gastro-esophageal reflux disease without esophagitis: Secondary | ICD-10-CM | POA: Diagnosis not present

## 2022-08-16 DIAGNOSIS — K760 Fatty (change of) liver, not elsewhere classified: Secondary | ICD-10-CM | POA: Diagnosis not present

## 2022-08-16 DIAGNOSIS — Z91040 Latex allergy status: Secondary | ICD-10-CM | POA: Diagnosis not present

## 2022-08-16 DIAGNOSIS — N1330 Unspecified hydronephrosis: Secondary | ICD-10-CM | POA: Diagnosis not present

## 2022-08-16 DIAGNOSIS — Z880 Allergy status to penicillin: Secondary | ICD-10-CM | POA: Diagnosis not present

## 2022-08-16 DIAGNOSIS — K5900 Constipation, unspecified: Secondary | ICD-10-CM | POA: Diagnosis not present

## 2022-08-16 DIAGNOSIS — R569 Unspecified convulsions: Secondary | ICD-10-CM | POA: Diagnosis not present

## 2022-08-16 DIAGNOSIS — F17210 Nicotine dependence, cigarettes, uncomplicated: Secondary | ICD-10-CM | POA: Diagnosis not present

## 2022-08-16 DIAGNOSIS — N201 Calculus of ureter: Secondary | ICD-10-CM | POA: Diagnosis not present

## 2022-08-16 DIAGNOSIS — G4730 Sleep apnea, unspecified: Secondary | ICD-10-CM | POA: Diagnosis not present

## 2022-08-16 DIAGNOSIS — R109 Unspecified abdominal pain: Secondary | ICD-10-CM | POA: Diagnosis not present

## 2022-08-16 DIAGNOSIS — N132 Hydronephrosis with renal and ureteral calculous obstruction: Secondary | ICD-10-CM | POA: Diagnosis not present

## 2022-08-16 DIAGNOSIS — Z881 Allergy status to other antibiotic agents status: Secondary | ICD-10-CM | POA: Diagnosis not present

## 2022-08-16 DIAGNOSIS — Z960 Presence of urogenital implants: Secondary | ICD-10-CM | POA: Diagnosis not present

## 2022-08-16 DIAGNOSIS — Z886 Allergy status to analgesic agent status: Secondary | ICD-10-CM | POA: Diagnosis not present

## 2022-08-16 DIAGNOSIS — E669 Obesity, unspecified: Secondary | ICD-10-CM | POA: Diagnosis not present

## 2022-09-17 DIAGNOSIS — Z48816 Encounter for surgical aftercare following surgery on the genitourinary system: Secondary | ICD-10-CM | POA: Diagnosis not present

## 2022-09-17 DIAGNOSIS — R8290 Unspecified abnormal findings in urine: Secondary | ICD-10-CM | POA: Diagnosis not present

## 2022-09-17 DIAGNOSIS — N200 Calculus of kidney: Secondary | ICD-10-CM | POA: Diagnosis not present

## 2022-09-26 DIAGNOSIS — N200 Calculus of kidney: Secondary | ICD-10-CM | POA: Diagnosis not present

## 2022-09-27 DIAGNOSIS — N200 Calculus of kidney: Secondary | ICD-10-CM | POA: Diagnosis not present

## 2022-11-14 DIAGNOSIS — R519 Headache, unspecified: Secondary | ICD-10-CM | POA: Diagnosis not present

## 2022-11-14 DIAGNOSIS — K219 Gastro-esophageal reflux disease without esophagitis: Secondary | ICD-10-CM | POA: Diagnosis not present

## 2022-11-14 DIAGNOSIS — M1990 Unspecified osteoarthritis, unspecified site: Secondary | ICD-10-CM | POA: Diagnosis not present

## 2022-11-14 DIAGNOSIS — F419 Anxiety disorder, unspecified: Secondary | ICD-10-CM | POA: Diagnosis not present

## 2022-11-14 DIAGNOSIS — E1165 Type 2 diabetes mellitus with hyperglycemia: Secondary | ICD-10-CM | POA: Diagnosis not present

## 2022-11-14 DIAGNOSIS — J301 Allergic rhinitis due to pollen: Secondary | ICD-10-CM | POA: Diagnosis not present

## 2022-11-14 DIAGNOSIS — D518 Other vitamin B12 deficiency anemias: Secondary | ICD-10-CM | POA: Diagnosis not present

## 2022-11-14 DIAGNOSIS — E785 Hyperlipidemia, unspecified: Secondary | ICD-10-CM | POA: Diagnosis not present

## 2022-11-14 DIAGNOSIS — F321 Major depressive disorder, single episode, moderate: Secondary | ICD-10-CM | POA: Diagnosis not present

## 2022-12-18 DIAGNOSIS — D518 Other vitamin B12 deficiency anemias: Secondary | ICD-10-CM | POA: Diagnosis not present

## 2023-01-03 DIAGNOSIS — N200 Calculus of kidney: Secondary | ICD-10-CM | POA: Diagnosis not present

## 2023-01-03 DIAGNOSIS — R300 Dysuria: Secondary | ICD-10-CM | POA: Diagnosis not present

## 2023-01-22 DIAGNOSIS — D518 Other vitamin B12 deficiency anemias: Secondary | ICD-10-CM | POA: Diagnosis not present

## 2023-02-26 DIAGNOSIS — D518 Other vitamin B12 deficiency anemias: Secondary | ICD-10-CM | POA: Diagnosis not present

## 2023-03-18 DIAGNOSIS — E785 Hyperlipidemia, unspecified: Secondary | ICD-10-CM | POA: Diagnosis not present

## 2023-03-18 DIAGNOSIS — E1165 Type 2 diabetes mellitus with hyperglycemia: Secondary | ICD-10-CM | POA: Diagnosis not present

## 2023-03-18 DIAGNOSIS — Z0000 Encounter for general adult medical examination without abnormal findings: Secondary | ICD-10-CM | POA: Diagnosis not present

## 2023-03-18 DIAGNOSIS — Z124 Encounter for screening for malignant neoplasm of cervix: Secondary | ICD-10-CM | POA: Diagnosis not present

## 2023-03-18 DIAGNOSIS — R87610 Atypical squamous cells of undetermined significance on cytologic smear of cervix (ASC-US): Secondary | ICD-10-CM | POA: Diagnosis not present

## 2023-04-02 DIAGNOSIS — D518 Other vitamin B12 deficiency anemias: Secondary | ICD-10-CM | POA: Diagnosis not present

## 2023-04-24 DIAGNOSIS — H524 Presbyopia: Secondary | ICD-10-CM | POA: Diagnosis not present

## 2023-05-08 DIAGNOSIS — D518 Other vitamin B12 deficiency anemias: Secondary | ICD-10-CM | POA: Diagnosis not present

## 2023-05-08 DIAGNOSIS — Z23 Encounter for immunization: Secondary | ICD-10-CM | POA: Diagnosis not present

## 2023-07-21 DIAGNOSIS — K219 Gastro-esophageal reflux disease without esophagitis: Secondary | ICD-10-CM | POA: Diagnosis not present

## 2023-07-21 DIAGNOSIS — E1165 Type 2 diabetes mellitus with hyperglycemia: Secondary | ICD-10-CM | POA: Diagnosis not present

## 2023-07-21 DIAGNOSIS — F321 Major depressive disorder, single episode, moderate: Secondary | ICD-10-CM | POA: Diagnosis not present

## 2023-07-21 DIAGNOSIS — D518 Other vitamin B12 deficiency anemias: Secondary | ICD-10-CM | POA: Diagnosis not present

## 2023-07-21 DIAGNOSIS — M1990 Unspecified osteoarthritis, unspecified site: Secondary | ICD-10-CM | POA: Diagnosis not present

## 2023-07-21 DIAGNOSIS — R300 Dysuria: Secondary | ICD-10-CM | POA: Diagnosis not present

## 2023-07-21 DIAGNOSIS — R519 Headache, unspecified: Secondary | ICD-10-CM | POA: Diagnosis not present

## 2023-07-21 DIAGNOSIS — E785 Hyperlipidemia, unspecified: Secondary | ICD-10-CM | POA: Diagnosis not present

## 2023-07-21 DIAGNOSIS — F419 Anxiety disorder, unspecified: Secondary | ICD-10-CM | POA: Diagnosis not present

## 2023-08-01 DIAGNOSIS — R109 Unspecified abdominal pain: Secondary | ICD-10-CM | POA: Diagnosis not present

## 2023-08-13 DIAGNOSIS — Z0000 Encounter for general adult medical examination without abnormal findings: Secondary | ICD-10-CM | POA: Diagnosis not present

## 2023-08-13 DIAGNOSIS — Z9181 History of falling: Secondary | ICD-10-CM | POA: Diagnosis not present

## 2023-08-20 DIAGNOSIS — D518 Other vitamin B12 deficiency anemias: Secondary | ICD-10-CM | POA: Diagnosis not present

## 2023-09-19 DIAGNOSIS — N200 Calculus of kidney: Secondary | ICD-10-CM | POA: Diagnosis not present

## 2023-09-23 DIAGNOSIS — D518 Other vitamin B12 deficiency anemias: Secondary | ICD-10-CM | POA: Diagnosis not present

## 2023-09-26 DIAGNOSIS — K648 Other hemorrhoids: Secondary | ICD-10-CM | POA: Diagnosis not present

## 2023-09-26 DIAGNOSIS — Z860100 Personal history of colon polyps, unspecified: Secondary | ICD-10-CM | POA: Diagnosis not present

## 2023-09-26 DIAGNOSIS — Z1211 Encounter for screening for malignant neoplasm of colon: Secondary | ICD-10-CM | POA: Diagnosis not present

## 2023-09-26 DIAGNOSIS — Z800 Family history of malignant neoplasm of digestive organs: Secondary | ICD-10-CM | POA: Diagnosis not present

## 2023-09-26 DIAGNOSIS — D125 Benign neoplasm of sigmoid colon: Secondary | ICD-10-CM | POA: Diagnosis not present

## 2023-09-26 DIAGNOSIS — K640 First degree hemorrhoids: Secondary | ICD-10-CM | POA: Diagnosis not present

## 2023-09-26 DIAGNOSIS — K635 Polyp of colon: Secondary | ICD-10-CM | POA: Diagnosis not present

## 2023-11-17 DIAGNOSIS — G8929 Other chronic pain: Secondary | ICD-10-CM | POA: Diagnosis not present

## 2023-11-17 DIAGNOSIS — E785 Hyperlipidemia, unspecified: Secondary | ICD-10-CM | POA: Diagnosis not present

## 2023-11-17 DIAGNOSIS — M1990 Unspecified osteoarthritis, unspecified site: Secondary | ICD-10-CM | POA: Diagnosis not present

## 2023-11-17 DIAGNOSIS — D518 Other vitamin B12 deficiency anemias: Secondary | ICD-10-CM | POA: Diagnosis not present

## 2023-11-17 DIAGNOSIS — R519 Headache, unspecified: Secondary | ICD-10-CM | POA: Diagnosis not present

## 2023-11-17 DIAGNOSIS — G4700 Insomnia, unspecified: Secondary | ICD-10-CM | POA: Diagnosis not present

## 2023-11-17 DIAGNOSIS — E559 Vitamin D deficiency, unspecified: Secondary | ICD-10-CM | POA: Diagnosis not present

## 2023-11-17 DIAGNOSIS — E1165 Type 2 diabetes mellitus with hyperglycemia: Secondary | ICD-10-CM | POA: Diagnosis not present

## 2023-11-17 DIAGNOSIS — K219 Gastro-esophageal reflux disease without esophagitis: Secondary | ICD-10-CM | POA: Diagnosis not present

## 2023-11-17 DIAGNOSIS — D539 Nutritional anemia, unspecified: Secondary | ICD-10-CM | POA: Diagnosis not present

## 2023-12-18 DIAGNOSIS — D518 Other vitamin B12 deficiency anemias: Secondary | ICD-10-CM | POA: Diagnosis not present

## 2023-12-18 DIAGNOSIS — R011 Cardiac murmur, unspecified: Secondary | ICD-10-CM | POA: Diagnosis not present

## 2023-12-18 DIAGNOSIS — I081 Rheumatic disorders of both mitral and tricuspid valves: Secondary | ICD-10-CM | POA: Diagnosis not present

## 2024-03-23 DIAGNOSIS — G43909 Migraine, unspecified, not intractable, without status migrainosus: Secondary | ICD-10-CM | POA: Diagnosis not present

## 2024-03-23 DIAGNOSIS — E785 Hyperlipidemia, unspecified: Secondary | ICD-10-CM | POA: Diagnosis not present

## 2024-03-23 DIAGNOSIS — R519 Headache, unspecified: Secondary | ICD-10-CM | POA: Diagnosis not present

## 2024-03-23 DIAGNOSIS — G8929 Other chronic pain: Secondary | ICD-10-CM | POA: Diagnosis not present

## 2024-03-23 DIAGNOSIS — D539 Nutritional anemia, unspecified: Secondary | ICD-10-CM | POA: Diagnosis not present

## 2024-03-23 DIAGNOSIS — K219 Gastro-esophageal reflux disease without esophagitis: Secondary | ICD-10-CM | POA: Diagnosis not present

## 2024-03-23 DIAGNOSIS — D518 Other vitamin B12 deficiency anemias: Secondary | ICD-10-CM | POA: Diagnosis not present

## 2024-03-23 DIAGNOSIS — M1990 Unspecified osteoarthritis, unspecified site: Secondary | ICD-10-CM | POA: Diagnosis not present

## 2024-03-23 DIAGNOSIS — E1165 Type 2 diabetes mellitus with hyperglycemia: Secondary | ICD-10-CM | POA: Diagnosis not present

## 2024-03-23 DIAGNOSIS — G4700 Insomnia, unspecified: Secondary | ICD-10-CM | POA: Diagnosis not present

## 2024-03-31 DIAGNOSIS — G43001 Migraine without aura, not intractable, with status migrainosus: Secondary | ICD-10-CM | POA: Diagnosis not present

## 2024-03-31 DIAGNOSIS — R519 Headache, unspecified: Secondary | ICD-10-CM | POA: Diagnosis not present

## 2024-03-31 DIAGNOSIS — R110 Nausea: Secondary | ICD-10-CM | POA: Diagnosis not present

## 2024-04-14 DIAGNOSIS — R519 Headache, unspecified: Secondary | ICD-10-CM | POA: Diagnosis not present

## 2024-04-28 DIAGNOSIS — D518 Other vitamin B12 deficiency anemias: Secondary | ICD-10-CM | POA: Diagnosis not present

## 2024-06-02 DIAGNOSIS — D518 Other vitamin B12 deficiency anemias: Secondary | ICD-10-CM | POA: Diagnosis not present

## 2024-06-14 NOTE — Progress Notes (Signed)
 Sally Garrison                                          MRN: [id number]   06/14/2024   The [facility] Quality Team Specialist reviewed this patient medical record for the purposes of chart review for care gap closure. The following were reviewed: chart review for care gap closure-kidney health evaluation for diabetes:eGFR  and uACR.    [facility] Quality Team

## 2024-07-21 NOTE — Progress Notes (Signed)
 Sally Garrison                                          MRN: [id number]   07/21/2024   The [facility] Quality Team Specialist reviewed this patient medical record for the purposes of chart review for care gap closure. The following were reviewed: chart review for care gap closure-diabetic eye exam and kidney health evaluation for diabetes:eGFR  and uACR.    [facility] Quality Team

## 2024-07-21 NOTE — Progress Notes (Signed)
 Sally Garrison                                          MRN: [id number]   07/21/2024   The [facility] Quality Team Specialist reviewed this patient medical record for the purposes of chart review for care gap closure. The following were reviewed: abstraction for care gap closure-glycemic status assessment.    [facility] Quality Team
